# Patient Record
Sex: Female | Born: 1970
Health system: Southern US, Community
[De-identification: ages and names within clinical notes are randomized; demographics above are authoritative.]

## PROBLEM LIST (undated history)

## (undated) DIAGNOSIS — F419 Anxiety disorder, unspecified: Secondary | ICD-10-CM

## (undated) DIAGNOSIS — K8020 Calculus of gallbladder without cholecystitis without obstruction: Secondary | ICD-10-CM

## (undated) DIAGNOSIS — K602 Anal fissure, unspecified: Secondary | ICD-10-CM

## (undated) DIAGNOSIS — Z8719 Personal history of other diseases of the digestive system: Secondary | ICD-10-CM

## (undated) DIAGNOSIS — T7840XA Allergy, unspecified, initial encounter: Secondary | ICD-10-CM

## (undated) DIAGNOSIS — J309 Allergic rhinitis, unspecified: Secondary | ICD-10-CM

## (undated) HISTORY — DX: Allergic rhinitis, unspecified: J30.9

## (undated) HISTORY — DX: Anal fissure, unspecified: K60.2

## (undated) HISTORY — DX: Personal history of other diseases of the digestive system: Z87.19

## (undated) HISTORY — PX: CERVICAL DISC SURGERY: SHX588

## (undated) HISTORY — DX: Anxiety disorder, unspecified: F41.9

## (undated) HISTORY — PX: WISDOM TOOTH EXTRACTION: SHX21

## (undated) HISTORY — DX: Allergy, unspecified, initial encounter: T78.40XA

## (undated) HISTORY — DX: Calculus of gallbladder without cholecystitis without obstruction: K80.20

---

## 1999-09-10 ENCOUNTER — Inpatient Hospital Stay (HOSPITAL_COMMUNITY): Admission: AD | Admit: 1999-09-10 | Discharge: 1999-09-10 | Payer: Self-pay | Admitting: Obstetrics and Gynecology

## 1999-10-14 DIAGNOSIS — K8020 Calculus of gallbladder without cholecystitis without obstruction: Secondary | ICD-10-CM

## 1999-10-14 DIAGNOSIS — K602 Anal fissure, unspecified: Secondary | ICD-10-CM

## 1999-10-14 HISTORY — DX: Calculus of gallbladder without cholecystitis without obstruction: K80.20

## 1999-10-14 HISTORY — DX: Anal fissure, unspecified: K60.2

## 1999-10-14 HISTORY — PX: CHOLECYSTECTOMY: SHX55

## 1999-11-21 ENCOUNTER — Ambulatory Visit (HOSPITAL_COMMUNITY): Admission: RE | Admit: 1999-11-21 | Discharge: 1999-11-21 | Payer: Self-pay

## 1999-11-25 ENCOUNTER — Ambulatory Visit: Admission: RE | Admit: 1999-11-25 | Discharge: 1999-11-25 | Payer: Self-pay | Admitting: Obstetrics and Gynecology

## 2000-01-20 ENCOUNTER — Inpatient Hospital Stay (HOSPITAL_COMMUNITY): Admission: AD | Admit: 2000-01-20 | Discharge: 2000-01-20 | Payer: Self-pay | Admitting: Obstetrics and Gynecology

## 2000-01-22 ENCOUNTER — Inpatient Hospital Stay (HOSPITAL_COMMUNITY): Admission: AD | Admit: 2000-01-22 | Discharge: 2000-01-22 | Payer: Self-pay | Admitting: Obstetrics and Gynecology

## 2000-02-15 ENCOUNTER — Inpatient Hospital Stay (HOSPITAL_COMMUNITY): Admission: AD | Admit: 2000-02-15 | Discharge: 2000-02-17 | Payer: Self-pay | Admitting: Obstetrics and Gynecology

## 2000-03-29 ENCOUNTER — Emergency Department (HOSPITAL_COMMUNITY): Admission: EM | Admit: 2000-03-29 | Discharge: 2000-03-29 | Payer: Self-pay | Admitting: Emergency Medicine

## 2000-03-30 ENCOUNTER — Encounter: Payer: Self-pay | Admitting: Emergency Medicine

## 2000-04-02 ENCOUNTER — Other Ambulatory Visit: Admission: RE | Admit: 2000-04-02 | Discharge: 2000-04-02 | Payer: Self-pay | Admitting: Obstetrics and Gynecology

## 2000-04-27 ENCOUNTER — Observation Stay (HOSPITAL_COMMUNITY): Admission: RE | Admit: 2000-04-27 | Discharge: 2000-04-28 | Payer: Self-pay | Admitting: General Surgery

## 2000-12-14 ENCOUNTER — Other Ambulatory Visit: Admission: RE | Admit: 2000-12-14 | Discharge: 2000-12-14 | Payer: Self-pay | Admitting: Obstetrics and Gynecology

## 2001-04-12 ENCOUNTER — Ambulatory Visit (HOSPITAL_COMMUNITY): Admission: RE | Admit: 2001-04-12 | Discharge: 2001-04-12 | Payer: Self-pay | Admitting: Obstetrics and Gynecology

## 2001-04-20 ENCOUNTER — Inpatient Hospital Stay (HOSPITAL_COMMUNITY): Admission: AD | Admit: 2001-04-20 | Discharge: 2001-04-20 | Payer: Self-pay | Admitting: Obstetrics and Gynecology

## 2001-06-21 ENCOUNTER — Inpatient Hospital Stay (HOSPITAL_COMMUNITY): Admission: AD | Admit: 2001-06-21 | Discharge: 2001-06-24 | Payer: Self-pay | Admitting: Obstetrics and Gynecology

## 2001-08-09 ENCOUNTER — Other Ambulatory Visit: Admission: RE | Admit: 2001-08-09 | Discharge: 2001-08-09 | Payer: Self-pay | Admitting: Obstetrics and Gynecology

## 2002-08-26 ENCOUNTER — Other Ambulatory Visit: Admission: RE | Admit: 2002-08-26 | Discharge: 2002-08-26 | Payer: Self-pay | Admitting: Obstetrics and Gynecology

## 2003-04-20 ENCOUNTER — Ambulatory Visit (HOSPITAL_COMMUNITY): Admission: RE | Admit: 2003-04-20 | Discharge: 2003-04-20 | Payer: Self-pay | Admitting: Obstetrics and Gynecology

## 2003-08-28 ENCOUNTER — Ambulatory Visit (HOSPITAL_COMMUNITY): Admission: RE | Admit: 2003-08-28 | Discharge: 2003-08-28 | Payer: Self-pay | Admitting: Obstetrics and Gynecology

## 2003-11-03 ENCOUNTER — Inpatient Hospital Stay (HOSPITAL_COMMUNITY): Admission: AD | Admit: 2003-11-03 | Discharge: 2003-11-05 | Payer: Self-pay | Admitting: Obstetrics and Gynecology

## 2003-12-11 ENCOUNTER — Other Ambulatory Visit: Admission: RE | Admit: 2003-12-11 | Discharge: 2003-12-11 | Payer: Self-pay | Admitting: Obstetrics and Gynecology

## 2004-08-08 ENCOUNTER — Ambulatory Visit (HOSPITAL_COMMUNITY): Admission: RE | Admit: 2004-08-08 | Discharge: 2004-08-08 | Payer: Self-pay | Admitting: Neurology

## 2005-02-10 ENCOUNTER — Other Ambulatory Visit: Admission: RE | Admit: 2005-02-10 | Discharge: 2005-02-10 | Payer: Self-pay | Admitting: Obstetrics and Gynecology

## 2012-02-04 ENCOUNTER — Other Ambulatory Visit: Payer: Self-pay | Admitting: Obstetrics and Gynecology

## 2012-02-04 DIAGNOSIS — R928 Other abnormal and inconclusive findings on diagnostic imaging of breast: Secondary | ICD-10-CM

## 2012-02-09 ENCOUNTER — Ambulatory Visit
Admission: RE | Admit: 2012-02-09 | Discharge: 2012-02-09 | Disposition: A | Discharge status: Home / Self Care | Payer: BC Managed Care – PPO | Source: Ambulatory Visit | Attending: Obstetrics and Gynecology | Admitting: Obstetrics and Gynecology

## 2012-02-09 DIAGNOSIS — R928 Other abnormal and inconclusive findings on diagnostic imaging of breast: Secondary | ICD-10-CM

## 2014-05-31 ENCOUNTER — Encounter: Payer: Self-pay | Admitting: Gastroenterology

## 2014-07-31 ENCOUNTER — Encounter: Payer: Self-pay | Admitting: Gastroenterology

## 2014-07-31 ENCOUNTER — Other Ambulatory Visit (INDEPENDENT_AMBULATORY_CARE_PROVIDER_SITE_OTHER): Payer: BC Managed Care – PPO

## 2014-07-31 ENCOUNTER — Ambulatory Visit (INDEPENDENT_AMBULATORY_CARE_PROVIDER_SITE_OTHER): Payer: BC Managed Care – PPO | Admitting: Gastroenterology

## 2014-07-31 VITALS — BP 110/62 | HR 68 | Ht 67.0 in | Wt 138.4 lb

## 2014-07-31 DIAGNOSIS — R109 Unspecified abdominal pain: Secondary | ICD-10-CM

## 2014-07-31 LAB — CBC WITH DIFFERENTIAL/PLATELET
BASOS PCT: 0.3 % (ref 0.0–3.0)
Basophils Absolute: 0 10*3/uL (ref 0.0–0.1)
EOS PCT: 0.9 % (ref 0.0–5.0)
Eosinophils Absolute: 0 10*3/uL (ref 0.0–0.7)
HCT: 40 % (ref 36.0–46.0)
Hemoglobin: 13.4 g/dL (ref 12.0–15.0)
Lymphocytes Relative: 34.1 % (ref 12.0–46.0)
Lymphs Abs: 1.7 10*3/uL (ref 0.7–4.0)
MCHC: 33.6 g/dL (ref 30.0–36.0)
MCV: 95.2 fl (ref 78.0–100.0)
MONOS PCT: 6.9 % (ref 3.0–12.0)
Monocytes Absolute: 0.3 10*3/uL (ref 0.1–1.0)
Neutro Abs: 2.8 10*3/uL (ref 1.4–7.7)
Neutrophils Relative %: 57.8 % (ref 43.0–77.0)
PLATELETS: 180 10*3/uL (ref 150.0–400.0)
RBC: 4.2 Mil/uL (ref 3.87–5.11)
RDW: 12.8 % (ref 11.5–15.5)
WBC: 4.9 10*3/uL (ref 4.0–10.5)

## 2014-07-31 LAB — COMPREHENSIVE METABOLIC PANEL
ALT: 17 U/L (ref 0–35)
AST: 20 U/L (ref 0–37)
Albumin: 3.6 g/dL (ref 3.5–5.2)
Alkaline Phosphatase: 60 U/L (ref 39–117)
BUN: 9 mg/dL (ref 6–23)
CALCIUM: 9.4 mg/dL (ref 8.4–10.5)
CO2: 30 meq/L (ref 19–32)
Chloride: 105 mEq/L (ref 96–112)
Creatinine, Ser: 0.7 mg/dL (ref 0.4–1.2)
GFR: 92.08 mL/min (ref >60.00)
GLUCOSE: 104 mg/dL — AB (ref 70–99)
POTASSIUM: 4.4 meq/L (ref 3.5–5.1)
SODIUM: 139 meq/L (ref 135–145)
TOTAL PROTEIN: 7.5 g/dL (ref 6.0–8.3)
Total Bilirubin: 0.5 mg/dL (ref 0.2–1.2)

## 2014-07-31 LAB — TSH: TSH: 1 u[IU]/mL (ref 0.35–4.50)

## 2014-07-31 LAB — SEDIMENTATION RATE: Sed Rate: 14 mm/hr (ref 0–22)

## 2014-07-31 NOTE — Progress Notes (Signed)
HPI: This is a very pleasant 43 year old woman whom I meeting for the first time today.     Discomfort in abd, full feeling, indigestion. Was bad in April and this past summer. Was put on flagyl and two rounds of PPI and the symptoms are better but still not normal.    Still has feeling of fullness, globus type sensation in AM usually.  Dirrhrea 3 times per week, loose stools. Previously was one solid, normal BM.  Prilosec helped for a week twice.    No nsaids.  She lost weigth over the summer, but gained it back.  No colon cancer in her family.  No colitis.   Review of systems: Pertinent positive and negative review of systems were noted in the above HPI section. Complete review of systems was performed and was otherwise normal.    Past Medical History  Diagnosis Date  . Anal fissure 2001  . Anxiety   . Gallstones 2001  . Allergic rhinitis     Past Surgical History  Procedure Laterality Date  . Cholecystectomy  2001    Current Outpatient Prescriptions  Medication Sig Dispense Refill  . loratadine (CLARITIN) 10 MG tablet Take 10 mg by mouth daily.      . Multiple Vitamin (MULTIVITAMIN) tablet Take 1 tablet by mouth daily.      . sertraline (ZOLOFT) 100 MG tablet Take 100 mg by mouth daily.      Marland Kitchen. triamcinolone (NASACORT ALLERGY 24HR) 55 MCG/ACT AERO nasal inhaler Place 2 sprays into the nose as needed.       No current facility-administered medications for this visit.    Allergies as of 07/31/2014 - Review Complete 07/31/2014  Allergen Reaction Noted  . Codeine Nausea And Vomiting 07/31/2014    Family History  Problem Relation Age of Onset  . Breast cancer Mother   . Breast cancer Paternal Grandmother   . Breast cancer Maternal Grandmother   . Other Father     nerve tumor  . Other Son     nerve tumor    History   Social History  . Marital Status: Married    Spouse Name: N/A    Number of Children: 3  . Years of Education: N/A   Occupational History   . minister   . attorney    Social History Main Topics  . Smoking status: Former Smoker -- 3 years    Types: Cigarettes    Quit date: 07/31/1994  . Smokeless tobacco: Never Used     Comment: in college  . Alcohol Use: 3.5 oz/week    7 drink(s) per week     Comment: 1 per week  . Drug Use: No  . Sexual Activity: Not on file   Other Topics Concern  . Not on file   Social History Narrative  . No narrative on file       Physical Exam: BP 110/62  Pulse 68  Ht 5\' 7"  (1.702 m)  Wt 138 lb 6 oz (62.766 kg)  BMI 21.67 kg/m2  LMP 07/16/2014 Constitutional: generally well-appearing Psychiatric: alert and oriented x3 Eyes: extraocular movements intact Mouth: oral pharynx moist, no lesions Neck: supple no lymphadenopathy Cardiovascular: heart regular rate and rhythm Lungs: clear to auscultation bilaterally Abdomen: soft, nontender, nondistended, no obvious ascites, no peritoneal signs, normal bowel sounds Extremities: no lower extremity edema bilaterally Skin: no lesions on visible extremities    Assessment and plan: 43 y.o. female with  dyspepsia, chest discomfort, change in bowels  She traveled  to [place] about a year ago for a mission trip. Her symptoms seemed to start after she returned home and it actually improved over the past few months. He were quite severe during the spring and early summer now she is left with bothersome bloating, some mild changes in her bowels and some chest discomfort. Proton pump inhibitors haven't helped briefly but not completely and not permanently. She'll have a basic set of labs including CBC, complete metabolic profile, stool testing, sedimentation rate. I will also proceed with EGD at her soonest convenience to check for peptic gastritis, perhaps H. pylori.

## 2014-07-31 NOTE — Patient Instructions (Signed)
You will have labs checked today in the basement lab.  Please head down after you check out with the front desk  (cmet, cbc, tsh, esr, stool for gi pathogen panel). You will be set up for an upper endoscopy for abdominal discomfort, bloating. A copy of this information will be made available to Dr. Joylene Draft.

## 2014-08-01 LAB — GASTROINTESTINAL PATHOGEN PANEL PCR
C. DIFFICILE TOX A/B, PCR: NEGATIVE
Campylobacter, PCR: NEGATIVE
Cryptosporidium, PCR: NEGATIVE
E coli (ETEC) LT/ST PCR: NEGATIVE
E coli (STEC) stx1/stx2, PCR: NEGATIVE
E coli 0157, PCR: NEGATIVE
GIARDIA LAMBLIA, PCR: NEGATIVE
NOROVIRUS, PCR: NEGATIVE
ROTAVIRUS, PCR: NEGATIVE
SALMONELLA, PCR: NEGATIVE
Shigella, PCR: NEGATIVE

## 2014-08-07 ENCOUNTER — Encounter: Payer: Self-pay | Admitting: Gastroenterology

## 2014-09-11 ENCOUNTER — Telehealth: Payer: Self-pay | Admitting: Gastroenterology

## 2014-09-11 NOTE — Telephone Encounter (Signed)
Returned patient;s call and she states that she has been vomiting since yesterday evening and the last time she vomited today was 3 am.  Darlyn Readelia McCoy, RN spoke with Dr. Christella HartiganJacobs and he advised the patient to reschedule.  There is not another available appt until Jan. 28th and patient wanted to keep the appt. For tomorrow.  I advised her that she needs to call if she is still sick and vomiting tomorrow.  She agreed to do so.  Dr. Christella HartiganJacobs was made aware of this.

## 2014-09-12 ENCOUNTER — Encounter: Payer: Self-pay | Admitting: Gastroenterology

## 2014-09-12 ENCOUNTER — Ambulatory Visit (AMBULATORY_SURGERY_CENTER): Payer: BC Managed Care – PPO | Admitting: Gastroenterology

## 2014-09-12 VITALS — BP 129/53 | HR 56 | Temp 97.6°F | Resp 18 | Ht 67.0 in | Wt 138.0 lb

## 2014-09-12 DIAGNOSIS — K2970 Gastritis, unspecified, without bleeding: Secondary | ICD-10-CM

## 2014-09-12 DIAGNOSIS — R109 Unspecified abdominal pain: Secondary | ICD-10-CM

## 2014-09-12 DIAGNOSIS — K2990 Gastroduodenitis, unspecified, without bleeding: Secondary | ICD-10-CM

## 2014-09-12 MED ORDER — SODIUM CHLORIDE 0.9 % IV SOLN: 500.0000 mL | INTRAVENOUS | Status: DC

## 2014-09-12 NOTE — Patient Instructions (Signed)
YOU HAD AN ENDOSCOPIC PROCEDURE TODAY AT THE [facility]: Refer to the procedure report that was given to you for any specific questions about what was found during the examination.  If the procedure report does not answer your questions, please call your gastroenterologist to clarify.  If you requested that your care partner not be given the details of your procedure findings, then the procedure report has been included in a sealed envelope for you to review at your convenience later.  YOU SHOULD EXPECT: Some feelings of bloating in the abdomen. Passage of more gas than usual.  Walking can help get rid of the air that was put into your GI tract during the procedure and reduce the bloating. If you had a lower endoscopy (such as a colonoscopy or flexible sigmoidoscopy) you may notice spotting of blood in your stool or on the toilet paper. If you underwent a bowel prep for your procedure, then you may not have a normal bowel movement for a few days.  DIET: Your first meal following the procedure should be a light meal and then it is ok to progress to your normal diet.  A half-sandwich or bowl of soup is an example of a good first meal.  Heavy or fried foods are harder to digest and may make you feel nauseous or bloated.  Likewise meals heavy in dairy and vegetables can cause extra gas to form and this can also increase the bloating.  Drink plenty of fluids but you should avoid alcoholic beverages for 24 hours.  ACTIVITY: Your care partner should take you home directly after the procedure.  You should plan to take it easy, moving slowly for the rest of the day.  You can resume normal activity the day after the procedure however you should NOT DRIVE or use heavy machinery for 24 hours (because of the sedation medicines used during the test).    SYMPTOMS TO REPORT IMMEDIATELY: A gastroenterologist can be reached at any hour.  During normal business hours, 8:30 AM to 5:00 PM Monday through Friday,  call [phone].  After hours and on weekends, please call the GI answering service at [phone] who will take a message and have the physician on call contact you.    Following upper endoscopy (EGD)  Vomiting of blood or coffee ground material  New chest pain or pain under the shoulder blades  Painful or persistently difficult swallowing  New shortness of breath  Fever of 100F or higher  Black, tarry-looking stools  FOLLOW UP: If any biopsies were taken you will be contacted by phone or by letter within the next 1-3 weeks.  Call your gastroenterologist if you have not heard about the biopsies in 3 weeks.  Our staff will call the home number listed on your records the next business day following your procedure to check on you and address any questions or concerns that you may have at that time regarding the information given to you following your procedure. This is a courtesy call and so if there is no answer at the home number and we have not heard from you through the emergency physician on call, we will assume that you have returned to your regular daily activities without incident.  SIGNATURES/CONFIDENTIALITY: You and/or your care partner have signed paperwork which will be entered into your electronic medical record.  These signatures attest to the fact that that the information above on your After Visit Summary has been reviewed and is understood.  Full   responsibility of the confidentiality of this discharge information lies with you and/or your care-partner.   Resume medications. Information given on gastritis with discharge instructions. 

## 2014-09-12 NOTE — Op Note (Signed)
[facility] [address] [place], [id number]   ENDOSCOPY PROCEDURE REPORT  PATIENT: Adriana NedShields, Meira S  MR#: [id number] BIRTHDATE: 1971/03/28 , 43  yrs. old GENDER: female ENDOSCOPIST: Rachael Feeaniel P Jacobs, MD REFERRED BY:  Rodrigo RanMark Perini, M.D. PROCEDURE DATE:  09/12/2014 PROCEDURE:  EGD w/ biopsy ASA CLASS:     Class II INDICATIONS:  dyspepsia, fullness (especially AM). MEDICATIONS: Monitored anesthesia care and Propofol 200 mg IV TOPICAL ANESTHETIC: Cetacaine Spray  DESCRIPTION OF PROCEDURE: After the risks benefits and alternatives of the procedure were thoroughly explained, informed consent was obtained.  The LB [id number] endoscope was introduced through the mouth and advanced to the second portion of the duodenum , Without limitations.  The instrument was slowly withdrawn as the mucosa was fully examined.   There was mild to moderate pan-gastritis.  The distal stomach was biopsied, sent to pathology.  The examination was otherwise normal. Retroflexed views revealed no abnormalities.     The scope was then withdrawn from the patient and the procedure completed.  COMPLICATIONS: There were no immediate complications.  ENDOSCOPIC IMPRESSION: There was mild to moderate pan-gastritis.  The distal stomach was biopsied, sent to pathology.  The examination was otherwise normal   RECOMMENDATIONS: Await final pathology.  If biopsies + for H.  pylori, will start on appropriate antibiotics.   eSigned:  Rachael Feeaniel P Jacobs, MD 09/12/2014 3:22 PM

## 2014-09-12 NOTE — Progress Notes (Signed)
Called to room to assist during endoscopic procedure.  Patient ID and intended procedure confirmed with present staff. Received instructions for my participation in the procedure from the performing physician.  

## 2014-09-13 ENCOUNTER — Telehealth: Payer: Self-pay | Admitting: *Deleted

## 2014-09-13 NOTE — Telephone Encounter (Signed)
  Follow up Call-  Call back number 09/12/2014  Post procedure Call Back phone  # [phone]  Permission to leave phone message Yes     Patient questions:  Do you have a fever, pain , or abdominal swelling? No. Pain Score  0 *  Have you tolerated food without any problems? Yes.    Have you been able to return to your normal activities? Yes.    Do you have any questions about your discharge instructions: Diet   No. Medications  No. Follow up visit  No.  Do you have questions or concerns about your Care? No.  Actions: * If pain score is 4 or above: No action needed, pain <4.

## 2014-09-20 ENCOUNTER — Encounter: Payer: Self-pay | Admitting: Gastroenterology

## 2014-09-21 ENCOUNTER — Other Ambulatory Visit: Payer: Self-pay | Admitting: Obstetrics and Gynecology

## 2014-09-21 ENCOUNTER — Encounter: Payer: Self-pay | Admitting: Gastroenterology

## 2014-09-22 LAB — CYTOLOGY - PAP

## 2015-06-01 ENCOUNTER — Other Ambulatory Visit: Payer: Self-pay | Admitting: Internal Medicine

## 2015-06-01 DIAGNOSIS — M546 Pain in thoracic spine: Secondary | ICD-10-CM

## 2015-06-05 ENCOUNTER — Ambulatory Visit
Admission: RE | Admit: 2015-06-05 | Discharge: 2015-06-05 | Disposition: A | Discharge status: Home / Self Care | Payer: BLUE CROSS/BLUE SHIELD | Source: Ambulatory Visit | Attending: Internal Medicine | Admitting: Internal Medicine

## 2015-06-05 DIAGNOSIS — M546 Pain in thoracic spine: Secondary | ICD-10-CM

## 2015-11-06 ENCOUNTER — Other Ambulatory Visit: Payer: Self-pay | Admitting: Obstetrics and Gynecology

## 2015-11-06 DIAGNOSIS — R928 Other abnormal and inconclusive findings on diagnostic imaging of breast: Secondary | ICD-10-CM

## 2015-11-20 ENCOUNTER — Ambulatory Visit
Admission: RE | Admit: 2015-11-20 | Discharge: 2015-11-20 | Disposition: A | Discharge status: Home / Self Care | Payer: BLUE CROSS/BLUE SHIELD | Source: Ambulatory Visit | Attending: Obstetrics and Gynecology | Admitting: Obstetrics and Gynecology

## 2015-11-20 DIAGNOSIS — R928 Other abnormal and inconclusive findings on diagnostic imaging of breast: Secondary | ICD-10-CM

## 2016-07-09 ENCOUNTER — Other Ambulatory Visit: Payer: Self-pay | Admitting: Obstetrics and Gynecology

## 2016-07-09 DIAGNOSIS — N6310 Unspecified lump in the right breast, unspecified quadrant: Secondary | ICD-10-CM

## 2016-07-22 ENCOUNTER — Ambulatory Visit
Admission: RE | Admit: 2016-07-22 | Discharge: 2016-07-22 | Disposition: A | Discharge status: Home / Self Care | Payer: BLUE CROSS/BLUE SHIELD | Source: Ambulatory Visit | Attending: Obstetrics and Gynecology | Admitting: Obstetrics and Gynecology

## 2016-07-22 DIAGNOSIS — N6310 Unspecified lump in the right breast, unspecified quadrant: Secondary | ICD-10-CM

## 2016-11-03 DIAGNOSIS — F4323 Adjustment disorder with mixed anxiety and depressed mood: Secondary | ICD-10-CM | POA: Diagnosis not present

## 2016-11-10 DIAGNOSIS — F4323 Adjustment disorder with mixed anxiety and depressed mood: Secondary | ICD-10-CM | POA: Diagnosis not present

## 2016-11-17 DIAGNOSIS — F4323 Adjustment disorder with mixed anxiety and depressed mood: Secondary | ICD-10-CM | POA: Diagnosis not present

## 2016-11-24 DIAGNOSIS — F4323 Adjustment disorder with mixed anxiety and depressed mood: Secondary | ICD-10-CM | POA: Diagnosis not present

## 2016-12-23 DIAGNOSIS — Z6821 Body mass index (BMI) 21.0-21.9, adult: Secondary | ICD-10-CM | POA: Diagnosis not present

## 2016-12-23 DIAGNOSIS — Z01419 Encounter for gynecological examination (general) (routine) without abnormal findings: Secondary | ICD-10-CM | POA: Diagnosis not present

## 2017-09-14 ENCOUNTER — Other Ambulatory Visit: Payer: Self-pay | Admitting: Obstetrics and Gynecology

## 2017-09-14 DIAGNOSIS — Z139 Encounter for screening, unspecified: Secondary | ICD-10-CM

## 2017-10-16 ENCOUNTER — Ambulatory Visit
Admission: RE | Admit: 2017-10-16 | Discharge: 2017-10-16 | Disposition: A | Discharge status: Home / Self Care | Payer: BLUE CROSS/BLUE SHIELD | Source: Ambulatory Visit | Attending: Obstetrics and Gynecology | Admitting: Obstetrics and Gynecology

## 2017-10-16 DIAGNOSIS — Z139 Encounter for screening, unspecified: Secondary | ICD-10-CM

## 2017-10-19 ENCOUNTER — Other Ambulatory Visit: Payer: Self-pay | Admitting: Internal Medicine

## 2017-10-19 DIAGNOSIS — N63 Unspecified lump in unspecified breast: Secondary | ICD-10-CM

## 2017-10-21 ENCOUNTER — Ambulatory Visit
Admission: RE | Admit: 2017-10-21 | Discharge: 2017-10-21 | Disposition: A | Discharge status: Home / Self Care | Payer: BLUE CROSS/BLUE SHIELD | Source: Ambulatory Visit | Attending: Internal Medicine | Admitting: Internal Medicine

## 2017-10-21 DIAGNOSIS — N63 Unspecified lump in unspecified breast: Secondary | ICD-10-CM

## 2017-10-21 DIAGNOSIS — R922 Inconclusive mammogram: Secondary | ICD-10-CM | POA: Diagnosis not present

## 2017-10-21 DIAGNOSIS — N6310 Unspecified lump in the right breast, unspecified quadrant: Secondary | ICD-10-CM | POA: Diagnosis not present

## 2018-07-29 DIAGNOSIS — E538 Deficiency of other specified B group vitamins: Secondary | ICD-10-CM | POA: Diagnosis not present

## 2018-07-29 DIAGNOSIS — D538 Other specified nutritional anemias: Secondary | ICD-10-CM | POA: Diagnosis not present

## 2018-07-29 DIAGNOSIS — Z0000 Encounter for general adult medical examination without abnormal findings: Secondary | ICD-10-CM | POA: Diagnosis not present

## 2018-07-29 DIAGNOSIS — R82998 Other abnormal findings in urine: Secondary | ICD-10-CM | POA: Diagnosis not present

## 2018-07-29 DIAGNOSIS — E7849 Other hyperlipidemia: Secondary | ICD-10-CM | POA: Diagnosis not present

## 2018-08-04 DIAGNOSIS — D538 Other specified nutritional anemias: Secondary | ICD-10-CM | POA: Diagnosis not present

## 2018-08-04 DIAGNOSIS — Z23 Encounter for immunization: Secondary | ICD-10-CM | POA: Diagnosis not present

## 2018-08-04 DIAGNOSIS — M5020 Other cervical disc displacement, unspecified cervical region: Secondary | ICD-10-CM | POA: Diagnosis not present

## 2018-08-04 DIAGNOSIS — Z1389 Encounter for screening for other disorder: Secondary | ICD-10-CM | POA: Diagnosis not present

## 2018-08-04 DIAGNOSIS — J3089 Other allergic rhinitis: Secondary | ICD-10-CM | POA: Diagnosis not present

## 2018-08-04 DIAGNOSIS — F418 Other specified anxiety disorders: Secondary | ICD-10-CM | POA: Diagnosis not present

## 2018-08-04 DIAGNOSIS — Z0000 Encounter for general adult medical examination without abnormal findings: Secondary | ICD-10-CM | POA: Diagnosis not present

## 2018-08-06 DIAGNOSIS — Z1212 Encounter for screening for malignant neoplasm of rectum: Secondary | ICD-10-CM | POA: Diagnosis not present

## 2018-10-04 ENCOUNTER — Other Ambulatory Visit: Payer: Self-pay | Admitting: Internal Medicine

## 2018-10-04 DIAGNOSIS — Z1231 Encounter for screening mammogram for malignant neoplasm of breast: Secondary | ICD-10-CM

## 2018-11-04 ENCOUNTER — Ambulatory Visit
Admission: RE | Admit: 2018-11-04 | Discharge: 2018-11-04 | Disposition: A | Discharge status: Home / Self Care | Payer: BLUE CROSS/BLUE SHIELD | Source: Ambulatory Visit | Attending: Internal Medicine | Admitting: Internal Medicine

## 2018-11-04 DIAGNOSIS — Z1231 Encounter for screening mammogram for malignant neoplasm of breast: Secondary | ICD-10-CM

## 2018-11-10 DIAGNOSIS — Z6821 Body mass index (BMI) 21.0-21.9, adult: Secondary | ICD-10-CM | POA: Diagnosis not present

## 2018-11-10 DIAGNOSIS — Z01419 Encounter for gynecological examination (general) (routine) without abnormal findings: Secondary | ICD-10-CM | POA: Diagnosis not present

## 2019-01-10 ENCOUNTER — Other Ambulatory Visit: Payer: Self-pay | Admitting: Internal Medicine

## 2019-01-10 ENCOUNTER — Other Ambulatory Visit: Payer: Self-pay

## 2019-01-10 ENCOUNTER — Ambulatory Visit (HOSPITAL_COMMUNITY)
Admission: RE | Admit: 2019-01-10 | Discharge: 2019-01-10 | Disposition: A | Discharge status: Home / Self Care | Payer: BLUE CROSS/BLUE SHIELD | Source: Ambulatory Visit | Attending: Internal Medicine | Admitting: Internal Medicine

## 2019-01-10 ENCOUNTER — Other Ambulatory Visit (HOSPITAL_COMMUNITY): Payer: Self-pay | Admitting: Internal Medicine

## 2019-01-10 DIAGNOSIS — R1031 Right lower quadrant pain: Secondary | ICD-10-CM

## 2019-01-10 MED ORDER — IOHEXOL 300 MG/ML  SOLN
100.0000 mL | Freq: Once | INTRAMUSCULAR | Status: AC | PRN
  Administered 2019-01-10: 100 mL via INTRAVENOUS

## 2019-02-23 ENCOUNTER — Ambulatory Visit (INDEPENDENT_AMBULATORY_CARE_PROVIDER_SITE_OTHER): Payer: BLUE CROSS/BLUE SHIELD | Admitting: Gastroenterology

## 2019-02-23 ENCOUNTER — Other Ambulatory Visit: Payer: Self-pay

## 2019-02-23 ENCOUNTER — Encounter: Payer: Self-pay | Admitting: Gastroenterology

## 2019-02-23 VITALS — Ht 67.0 in | Wt 135.0 lb

## 2019-02-23 DIAGNOSIS — K5732 Diverticulitis of large intestine without perforation or abscess without bleeding: Secondary | ICD-10-CM | POA: Diagnosis not present

## 2019-02-23 MED ORDER — PEG 3350-KCL-NA BICARB-NACL 420 G PO SOLR: 4000.0000 mL | ORAL | 0 refills | Status: DC

## 2019-02-23 NOTE — Progress Notes (Signed)
Review of pertinent gastrointestinal problems: 1.  Dyspepsia, abdominal fullness work-up 2015; lab testing October 2015 CBC, sed rate, TSH, complete metabolic profile were all normal.  Upper endoscopy December 2015, Dr. Christella HartiganJacobs done for dyspepsia, fullness especially in the mornings.  Findings mild to moderate pan gastritis, biopsies were taken and showed no sign of infection or cancer.   This service was provided via virtual visit.  Both audio and visual were used. The patient was located at home.  I was located in my office.  The patient did consent to this virtual visit and is aware of possible charges through their insurance for this visit.  The patient is a .  My certified medical assistant, Barbaraann RondoKelly Tadros, contributed to this visit by contacting the patient by phone 1 or 2 business days prior to the appointment and also followed up on the recommendations I made after the visit.  Time spent on virtual visit: 28 min   HPI: This is a very pleasant 48 year old woman whom I last saw for 5 years ago at the time of upper endoscopy and some upper GI issues.   She was referred back for a new problem by her primary care physician Dr. Sherrye PayorParini.  About 2 months ago, she awoke with RLQ pain, then fevers set in. She saw her PCP.   She completed a course of augmentin (had unpleasant experience on flagyl, turned her tongue yellow and messed with her taste).  10-14 day course and her symptoms went away after 2-3 days of starting.  Shortly after completing the course her fevers, pains recurred (worse than the first time).  Augmenting repeated 2 weeks which ended 2 weeks ago and she's felt nearly normal now.  Feels great in comparison now, but still has intermittent odd pains located in the same location.  Never really left sided pains.  Overall she's lost weight due to a poor appetite; 3-5 pounds overall.  No FH of colon cancer.  She's never had a colonoscopy but she has had stool testing (in 2019 with her  PCP).  CT scan abdomen pelvis with IV and oral contrast March 2020 done for right lower quadrant pain.  Findings "acute diverticulitis of the mid sigmoid colon.  No abscess or perforation.  Normal appendix."  I reviewed the report and the images personally.   Chief complaint is acute diverticulitis  ROS: complete GI ROS as described in HPI, all other review negative.  Constitutional:  No unintentional weight loss   Past Medical History:  Diagnosis Date  . Allergic rhinitis   . Allergy   . Anal fissure 2001  . Anxiety   . Gallstones 2001    Past Surgical History:  Procedure Laterality Date  . CERVICAL DISC SURGERY    . CHOLECYSTECTOMY  2001  . WISDOM TOOTH EXTRACTION      Current Outpatient Medications  Medication Sig Dispense Refill  . ibuprofen (ADVIL,MOTRIN) 200 MG tablet Take 200 mg by mouth every 6 (six) hours as needed.    . loratadine (CLARITIN) 10 MG tablet Take 10 mg by mouth daily.    . Multiple Vitamin (MULTIVITAMIN) tablet Take 1 tablet by mouth daily.    . sertraline (ZOLOFT) 100 MG tablet Take 100 mg by mouth daily.    Marland Kitchen. triamcinolone (NASACORT ALLERGY 24HR) 55 MCG/ACT AERO nasal inhaler Place 2 sprays into the nose as needed.     No current facility-administered medications for this visit.     Allergies as of 02/23/2019 - Review Complete 02/22/2019  Allergen Reaction Noted  . Codeine Nausea And Vomiting 07/31/2014    Family History  Problem Relation Age of Onset  . Breast cancer Mother 38  . Other Father        nerve tumor  . Breast cancer Paternal Grandmother   . Breast cancer Maternal Grandmother   . Other Son        nerve tumor  . Tuberculosis Brother     Social History   Socioeconomic History  . Marital status: Married    Spouse name: Not on file  . Number of children: 3  . Years of education: Not on file  . Highest education level: Not on file  Occupational History  . Occupation: Optician, dispensing  . Occupation: attorney  Social Needs  .  Financial resource strain: Not on file  . Food insecurity:    Worry: Not on file    Inability: Not on file  . Transportation needs:    Medical: Not on file    Non-medical: Not on file  Tobacco Use  . Smoking status: Former Smoker    Years: 3.00    Types: Cigarettes    Last attempt to quit: 07/31/1994    Years since quitting: 24.5  . Smokeless tobacco: Never Used  . Tobacco comment: in college  Substance and Sexual Activity  . Alcohol use: Yes    Alcohol/week: 7.0 standard drinks    Types: 7 Standard drinks or equivalent per week    Comment: 1 per week  . Drug use: No  . Sexual activity: Not on file  Lifestyle  . Physical activity:    Days per week: Not on file    Minutes per session: Not on file  . Stress: Not on file  Relationships  . Social connections:    Talks on phone: Not on file    Gets together: Not on file    Attends religious service: Not on file    Active member of club or organization: Not on file    Attends meetings of clubs or organizations: Not on file    Relationship status: Not on file  . Intimate partner violence:    Fear of current or ex partner: Not on file    Emotionally abused: Not on file    Physically abused: Not on file    Forced sexual activity: Not on file  Other Topics Concern  . Not on file  Social History Narrative  . Not on file     Physical Exam: Unable to perform because this was a "telemed visit" due to current Covid-19 pandemic  Assessment and plan: 48 y.o. female with acute diverticulitis  We discussed diverticulosis, diverticulitis.  I explained to her that she does not need to avoid seeds and nuts, berries in her diet as this is been shown to not decrease incidence of recurrent diverticulitis.  I did advise that she try to keep a relatively high-fiber diet, with a goal of keeping her bowels moving regularly without much pushing or straining.  She understands that colon cancer can sometimes mimic diverticulitis and I  recommended that she undergo colonoscopy to exclude that.  I see no reason for any further blood tests or imaging studies before colonoscopy.  Please see the "Patient Instructions" section for addition details about the plan.  Rob Bunting, MD [facility] 02/23/2019, 8:51 AM

## 2019-02-23 NOTE — Patient Instructions (Signed)
We will arrange a colonoscopy, my first available in [facility], for recent diverticulitis.  She knows that she does not need to avoid seeds, nuts, berries as that has been shown to not decrease chances of recurrent diverticulitis.

## 2019-03-02 ENCOUNTER — Telehealth: Payer: Self-pay | Admitting: *Deleted

## 2019-03-02 NOTE — Telephone Encounter (Signed)
Covid-19 travel screening questions  Have you traveled in the last 14 days? No If yes where?  Do you now or have you had a fever in the last 14 days? No  Do you have any respiratory symptoms of shortness of breath or cough now or in the last 14 days? No  Do you have any family members or close contacts with diagnosed or suspected Covid-19? No       

## 2019-03-02 NOTE — Telephone Encounter (Signed)
Called patient to complete pre-procedure Covid-19 screening. No answer. LMOM.       

## 2019-03-04 ENCOUNTER — Ambulatory Visit (AMBULATORY_SURGERY_CENTER): Payer: BLUE CROSS/BLUE SHIELD | Admitting: Gastroenterology

## 2019-03-04 ENCOUNTER — Other Ambulatory Visit: Payer: Self-pay

## 2019-03-04 ENCOUNTER — Encounter: Payer: Self-pay | Admitting: Gastroenterology

## 2019-03-04 VITALS — BP 112/65 | HR 55 | Temp 98.3°F | Resp 17 | Ht 67.0 in | Wt 135.0 lb

## 2019-03-04 DIAGNOSIS — K5732 Diverticulitis of large intestine without perforation or abscess without bleeding: Secondary | ICD-10-CM

## 2019-03-04 MED ORDER — SODIUM CHLORIDE 0.9 % IV SOLN: 500.0000 mL | INTRAVENOUS | Status: DC

## 2019-03-04 NOTE — Progress Notes (Addendum)
Carrizales, CMA - temp Juel Burrow, CMA - VS  Pt reported she had root canals in her front top two teeth.  They are slightly loose.  maw

## 2019-03-04 NOTE — Progress Notes (Signed)
PT taken to PACU. Monitors in place. VSS. Report given to RN. 

## 2019-03-04 NOTE — Op Note (Signed)
[facility] Patient Name: Adriana Simpson Procedure Date: 03/04/2019 10:43 AM MRN: [id number] Endoscopist: Rachael Fee , MD Age: 48 Referring MD:  Date of Birth: Jan 20, 1971 Gender: Female Account #: [id number] Procedure:                Colonoscopy Indications:              Follow-up of diverticulitis Medicines:                Monitored Anesthesia Care Procedure:                Pre-Anesthesia Assessment:                           - Prior to the procedure, a History and Physical                            was performed, and patient medications and                            allergies were reviewed. The patient's tolerance of                            previous anesthesia was also reviewed. The risks                            and benefits of the procedure and the sedation                            options and risks were discussed with the patient.                            All questions were answered, and informed consent                            was obtained. Prior Anticoagulants: The patient has                            taken no previous anticoagulant or antiplatelet                            agents. ASA Grade Assessment: II - A patient with                            mild systemic disease. After reviewing the risks                            and benefits, the patient was deemed in                            satisfactory condition to undergo the procedure.                           After obtaining informed consent, the colonoscope  was passed under direct vision. Throughout the                            procedure, the patient's blood pressure, pulse, and                            oxygen saturations were monitored continuously. The                            Colonoscope was introduced through the anus and                            advanced to the the cecum, identified by                            appendiceal orifice and ileocecal  valve. The                            colonoscopy was performed without difficulty. The                            patient tolerated the procedure well. The quality                            of the bowel preparation was good. The ileocecal                            valve, appendiceal orifice, and rectum were                            photographed. Scope In: 10:45:10 AM Scope Out: 10:54:25 AM Scope Withdrawal Time: 0 hours 5 minutes 43 seconds  Total Procedure Duration: 0 hours 9 minutes 15 seconds  Findings:                 A few small-mouthed diverticula were found in the                            left colon.                           The exam was otherwise without abnormality on                            direct and retroflexion views. Complications:            No immediate complications. Estimated blood loss:                            None. Estimated Blood Loss:     Estimated blood loss: none. Impression:               - Diverticulosis in the left colon.                           - The examination was otherwise normal on direct  and retroflexion views.                           - No polyps or cancers. Recommendation:           - Patient has a contact number available for                            emergencies. The signs and symptoms of potential                            delayed complications were discussed with the                            patient. Return to normal activities tomorrow.                            Written discharge instructions were provided to the                            patient.                           - Resume previous diet.                           - Continue present medications.                           - Repeat colonoscopy in 10 years for screening. Rachael Fee, MD 03/04/2019 10:57:54 AM This report has been signed electronically.

## 2019-03-04 NOTE — Patient Instructions (Signed)
Discharge instructions given. Handout on diverticulosis. Resume previous medications. YOU HAD AN ENDOSCOPIC PROCEDURE TODAY AT THE [facility]:   Refer to the procedure report that was given to you for any specific questions about what was found during the examination.  If the procedure report does not answer your questions, please call your gastroenterologist to clarify.  If you requested that your care partner not be given the details of your procedure findings, then the procedure report has been included in a sealed envelope for you to review at your convenience later.  YOU SHOULD EXPECT: Some feelings of bloating in the abdomen. Passage of more gas than usual.  Walking can help get rid of the air that was put into your GI tract during the procedure and reduce the bloating. If you had a lower endoscopy (such as a colonoscopy or flexible sigmoidoscopy) you may notice spotting of blood in your stool or on the toilet paper. If you underwent a bowel prep for your procedure, you may not have a normal bowel movement for a few days.  Please Note:  You might notice some irritation and congestion in your nose or some drainage.  This is from the oxygen used during your procedure.  There is no need for concern and it should clear up in a day or so.  SYMPTOMS TO REPORT IMMEDIATELY:   Following lower endoscopy (colonoscopy or flexible sigmoidoscopy):  Excessive amounts of blood in the stool  Significant tenderness or worsening of abdominal pains  Swelling of the abdomen that is new, acute  Fever of 100F or higher   For urgent or emergent issues, a gastroenterologist can be reached at any hour by calling [phone].   DIET:  We do recommend a small meal at first, but then you may proceed to your regular diet.  Drink plenty of fluids but you should avoid alcoholic beverages for 24 hours.  ACTIVITY:  You should plan to take it easy for the rest of today and you should NOT DRIVE or use  heavy machinery until tomorrow (because of the sedation medicines used during the test).    FOLLOW UP: Our staff will call the number listed on your records 48-72 hours following your procedure to check on you and address any questions or concerns that you may have regarding the information given to you following your procedure. If we do not reach you, we will leave a message.  We will attempt to reach you two times.  During this call, we will ask if you have developed any symptoms of COVID 19. If you develop any symptoms (for example fever, flu-like symptoms, shortness of breath, cough etc.) before then, please call [phone].  If any biopsies were taken you will be contacted by phone or by letter within the next 1-3 weeks.  Please call us at [phone] if you have not heard about the biopsies in 3 weeks.    SIGNATURES/CONFIDENTIALITY: You and/or your care partner have signed paperwork which will be entered into your electronic medical record.  These signatures attest to the fact that that the information above on your After Visit Summary has been reviewed and is understood.  Full responsibility of the confidentiality of this discharge information lies with you and/or your care-partner.

## 2019-03-08 ENCOUNTER — Telehealth: Payer: Self-pay

## 2019-03-08 ENCOUNTER — Telehealth: Payer: Self-pay | Admitting: *Deleted

## 2019-03-08 NOTE — Telephone Encounter (Signed)
  Follow up Call-  Call back number 03/04/2019  Post procedure Call Back phone  # #[phone] cell  Permission to leave phone message Yes  Some recent data might be hidden     Patient questions:  Do you have a fever, pain , or abdominal swelling? No. Pain Score  0 *  Have you tolerated food without any problems? Yes.    Have you been able to return to your normal activities? Yes.    Do you have any questions about your discharge instructions: Diet   No. Medications  No. Follow up visit  No.  Do you have questions or concerns about your Care? No.  Actions: * If pain score is 4 or above: No action needed, pain <4.   1. Have you developed a fever since your procedure? no  2.   Have you had an respiratory symptoms (SOB or cough) since your procedure?  no 3.   Have you tested positive for COVID 19 since your procedure  no  4.   Have you had any family members/close contacts diagnosed with the COVID 19 since your procedure?  no  If any of these questions are a yes, please inquire if patient has been seen by family doctor and route this note to Laverna Peace, Charity fundraiser.

## 2019-03-08 NOTE — Telephone Encounter (Signed)
Left message on follow up call. 

## 2019-09-20 DIAGNOSIS — E7849 Other hyperlipidemia: Secondary | ICD-10-CM | POA: Diagnosis not present

## 2019-09-27 DIAGNOSIS — Z0000 Encounter for general adult medical examination without abnormal findings: Secondary | ICD-10-CM | POA: Diagnosis not present

## 2019-09-27 DIAGNOSIS — Z1339 Encounter for screening examination for other mental health and behavioral disorders: Secondary | ICD-10-CM | POA: Diagnosis not present

## 2019-09-27 DIAGNOSIS — K5792 Diverticulitis of intestine, part unspecified, without perforation or abscess without bleeding: Secondary | ICD-10-CM | POA: Diagnosis not present

## 2019-09-27 DIAGNOSIS — Z803 Family history of malignant neoplasm of breast: Secondary | ICD-10-CM | POA: Diagnosis not present

## 2019-09-27 DIAGNOSIS — M5020 Other cervical disc displacement, unspecified cervical region: Secondary | ICD-10-CM | POA: Diagnosis not present

## 2019-09-27 DIAGNOSIS — D539 Nutritional anemia, unspecified: Secondary | ICD-10-CM | POA: Diagnosis not present

## 2019-10-05 DIAGNOSIS — R82998 Other abnormal findings in urine: Secondary | ICD-10-CM | POA: Diagnosis not present

## 2019-11-29 DIAGNOSIS — Z01419 Encounter for gynecological examination (general) (routine) without abnormal findings: Secondary | ICD-10-CM | POA: Diagnosis not present

## 2019-11-29 DIAGNOSIS — Z6821 Body mass index (BMI) 21.0-21.9, adult: Secondary | ICD-10-CM | POA: Diagnosis not present

## 2019-11-29 DIAGNOSIS — N762 Acute vulvitis: Secondary | ICD-10-CM | POA: Diagnosis not present

## 2019-12-15 ENCOUNTER — Other Ambulatory Visit: Payer: Self-pay | Admitting: Internal Medicine

## 2019-12-15 DIAGNOSIS — Z1231 Encounter for screening mammogram for malignant neoplasm of breast: Secondary | ICD-10-CM

## 2019-12-19 ENCOUNTER — Other Ambulatory Visit: Payer: Self-pay

## 2019-12-19 ENCOUNTER — Ambulatory Visit
Admission: RE | Admit: 2019-12-19 | Discharge: 2019-12-19 | Disposition: A | Discharge status: Home / Self Care | Payer: BC Managed Care – PPO | Source: Ambulatory Visit | Attending: Internal Medicine | Admitting: Internal Medicine

## 2019-12-19 DIAGNOSIS — Z1231 Encounter for screening mammogram for malignant neoplasm of breast: Secondary | ICD-10-CM

## 2020-03-06 DIAGNOSIS — R102 Pelvic and perineal pain: Secondary | ICD-10-CM | POA: Diagnosis not present

## 2020-03-06 DIAGNOSIS — N950 Postmenopausal bleeding: Secondary | ICD-10-CM | POA: Diagnosis not present

## 2020-03-16 DIAGNOSIS — N950 Postmenopausal bleeding: Secondary | ICD-10-CM | POA: Diagnosis not present

## 2020-03-16 DIAGNOSIS — K602 Anal fissure, unspecified: Secondary | ICD-10-CM | POA: Diagnosis not present

## 2020-03-16 DIAGNOSIS — N766 Ulceration of vulva: Secondary | ICD-10-CM | POA: Diagnosis not present

## 2020-04-03 DIAGNOSIS — N950 Postmenopausal bleeding: Secondary | ICD-10-CM | POA: Diagnosis not present

## 2020-08-14 DIAGNOSIS — H5213 Myopia, bilateral: Secondary | ICD-10-CM | POA: Diagnosis not present

## 2020-08-14 DIAGNOSIS — H524 Presbyopia: Secondary | ICD-10-CM | POA: Diagnosis not present

## 2020-09-21 DIAGNOSIS — Z0000 Encounter for general adult medical examination without abnormal findings: Secondary | ICD-10-CM | POA: Diagnosis not present

## 2020-09-21 DIAGNOSIS — E785 Hyperlipidemia, unspecified: Secondary | ICD-10-CM | POA: Diagnosis not present

## 2020-09-28 DIAGNOSIS — Z23 Encounter for immunization: Secondary | ICD-10-CM | POA: Diagnosis not present

## 2020-09-28 DIAGNOSIS — R82998 Other abnormal findings in urine: Secondary | ICD-10-CM | POA: Diagnosis not present

## 2020-09-28 DIAGNOSIS — E785 Hyperlipidemia, unspecified: Secondary | ICD-10-CM | POA: Diagnosis not present

## 2020-09-28 DIAGNOSIS — Z0000 Encounter for general adult medical examination without abnormal findings: Secondary | ICD-10-CM | POA: Diagnosis not present

## 2020-09-28 DIAGNOSIS — Z1331 Encounter for screening for depression: Secondary | ICD-10-CM | POA: Diagnosis not present

## 2021-02-04 DIAGNOSIS — Z6821 Body mass index (BMI) 21.0-21.9, adult: Secondary | ICD-10-CM | POA: Diagnosis not present

## 2021-02-04 DIAGNOSIS — A609 Anogenital herpesviral infection, unspecified: Secondary | ICD-10-CM | POA: Diagnosis not present

## 2021-02-04 DIAGNOSIS — Z01419 Encounter for gynecological examination (general) (routine) without abnormal findings: Secondary | ICD-10-CM | POA: Diagnosis not present

## 2021-05-15 ENCOUNTER — Other Ambulatory Visit: Payer: Self-pay | Admitting: Internal Medicine

## 2021-05-15 ENCOUNTER — Other Ambulatory Visit: Payer: Self-pay

## 2021-05-15 ENCOUNTER — Ambulatory Visit
Admission: RE | Admit: 2021-05-15 | Discharge: 2021-05-15 | Disposition: A | Discharge status: Home / Self Care | Payer: BC Managed Care – PPO | Source: Ambulatory Visit | Attending: Internal Medicine | Admitting: Internal Medicine

## 2021-05-15 DIAGNOSIS — Z1231 Encounter for screening mammogram for malignant neoplasm of breast: Secondary | ICD-10-CM | POA: Diagnosis not present

## 2021-11-07 DIAGNOSIS — E785 Hyperlipidemia, unspecified: Secondary | ICD-10-CM | POA: Diagnosis not present

## 2021-11-14 DIAGNOSIS — Z1212 Encounter for screening for malignant neoplasm of rectum: Secondary | ICD-10-CM | POA: Diagnosis not present

## 2021-11-14 DIAGNOSIS — Z0000 Encounter for general adult medical examination without abnormal findings: Secondary | ICD-10-CM | POA: Diagnosis not present

## 2021-11-14 DIAGNOSIS — Z5181 Encounter for therapeutic drug level monitoring: Secondary | ICD-10-CM | POA: Diagnosis not present

## 2021-11-14 DIAGNOSIS — G5601 Carpal tunnel syndrome, right upper limb: Secondary | ICD-10-CM | POA: Diagnosis not present

## 2021-11-14 DIAGNOSIS — Z1331 Encounter for screening for depression: Secondary | ICD-10-CM | POA: Diagnosis not present

## 2021-11-14 DIAGNOSIS — Z1339 Encounter for screening examination for other mental health and behavioral disorders: Secondary | ICD-10-CM | POA: Diagnosis not present

## 2021-11-18 ENCOUNTER — Other Ambulatory Visit: Payer: Self-pay | Admitting: Internal Medicine

## 2021-11-18 DIAGNOSIS — Z803 Family history of malignant neoplasm of breast: Secondary | ICD-10-CM

## 2021-12-10 ENCOUNTER — Ambulatory Visit
Admission: RE | Admit: 2021-12-10 | Discharge: 2021-12-10 | Disposition: A | Discharge status: Home / Self Care | Payer: No Typology Code available for payment source | Source: Ambulatory Visit | Attending: Internal Medicine | Admitting: Internal Medicine

## 2021-12-10 DIAGNOSIS — Z1239 Encounter for other screening for malignant neoplasm of breast: Secondary | ICD-10-CM | POA: Diagnosis not present

## 2021-12-10 DIAGNOSIS — Z803 Family history of malignant neoplasm of breast: Secondary | ICD-10-CM

## 2021-12-10 MED ORDER — GADOBUTROL 1 MMOL/ML IV SOLN
7.0000 mL | Freq: Once | INTRAVENOUS | Status: AC | PRN
  Administered 2021-12-10: 7 mL via INTRAVENOUS

## 2022-07-12 IMAGING — MR MR BREAST WO/W CM  BILAT
5 series · 30 of 48 positions shown · IV contrast (7 ml gadavist)
Comparison: No previous breast MRI.

CLINICAL DATA: Abbreviated Breast MRI for breast cancer screening.

LABS:  Not performed at imaging site.
EXAM:
BILATERAL ABBREVIATED BREAST MRI WITH AND WITHOUT CONTRAST
TECHNIQUE: Multiplanar, multisequence MR images of both breasts were obtained
prior to and following the intravenous administration of 7 ml of
Gadavist

[Series 2: t2_tirm_tra ipat (a-p) · axial · 3.0mm · 0.70mm/px · z∈[-101,+55]mm · 5 of 53 slices shown]
[im 1/53]
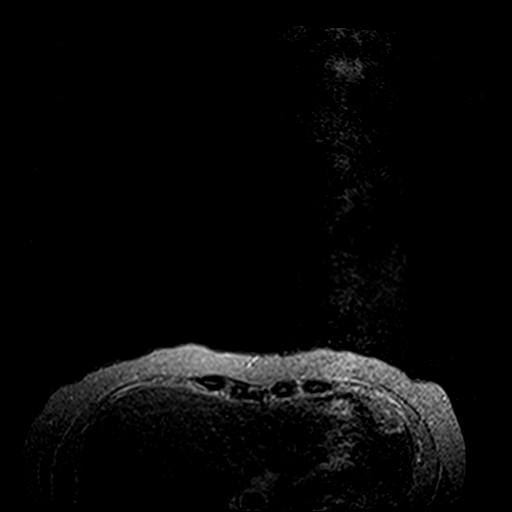
[im 14/53]
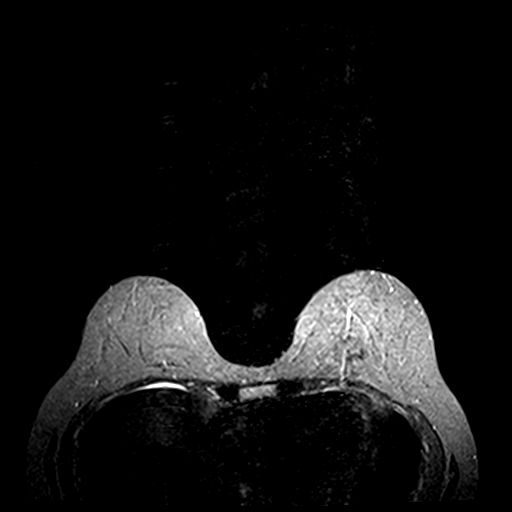
[im 27/53]
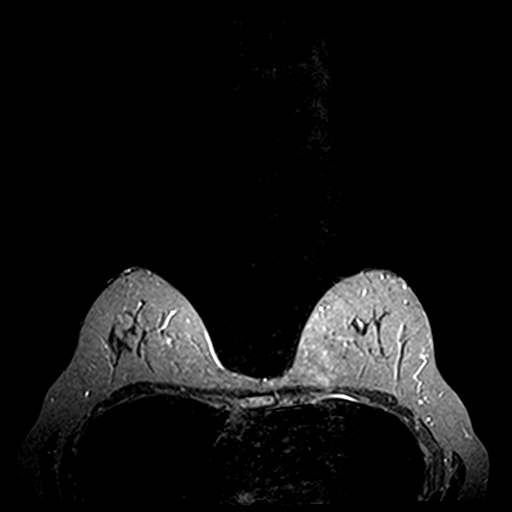
[im 40/53]
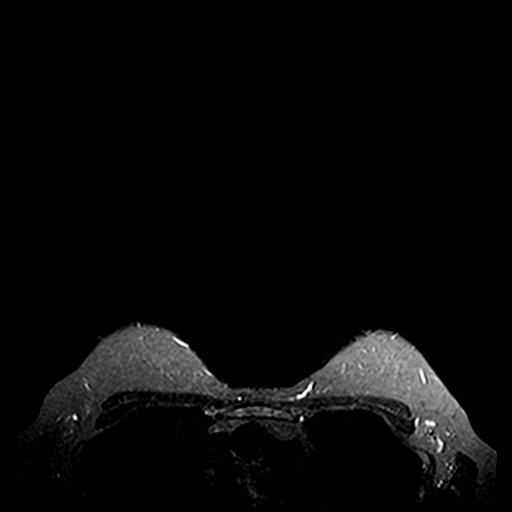
[im 53/53]
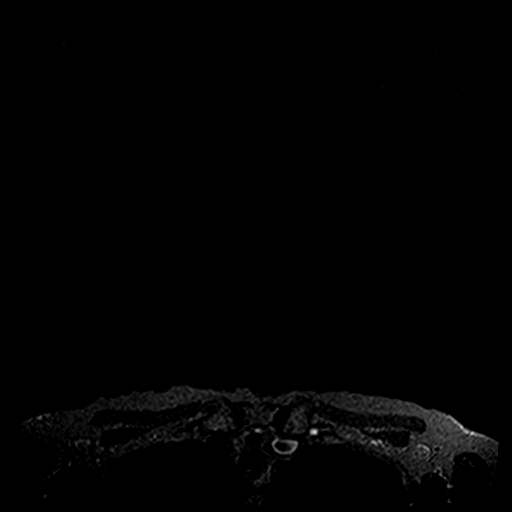

[Series 3: fl3d pre-cm · axial · non-contrast · 1.2mm · 0.94mm/px · z∈[-108,+63]mm · 8 of 144 slices shown]
[im 1/144]
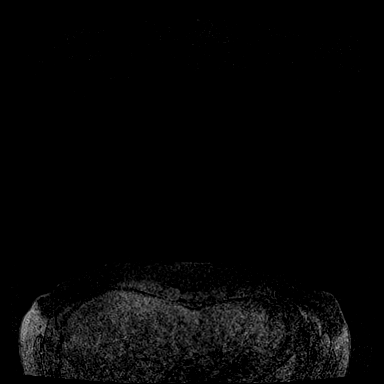
[im 23/144]
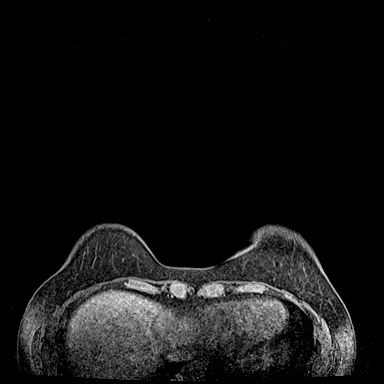
[im 45/144]
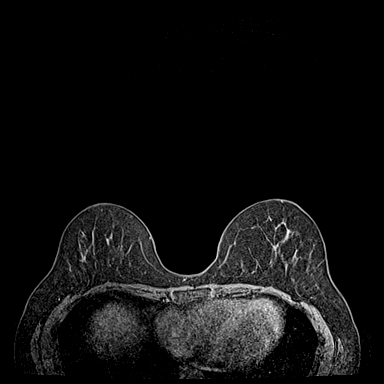
[im 67/144]
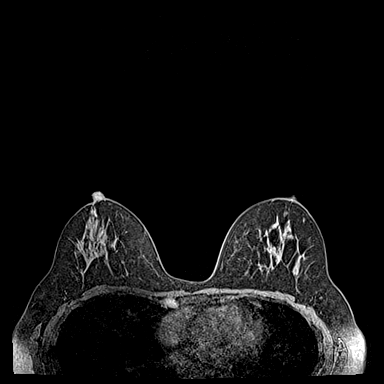
[im 78/144]
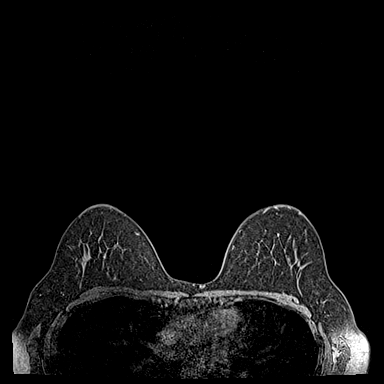
[im 100/144]
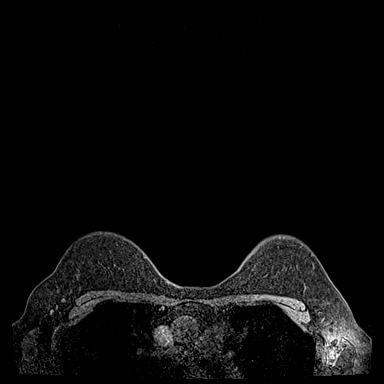
[im 122/144]
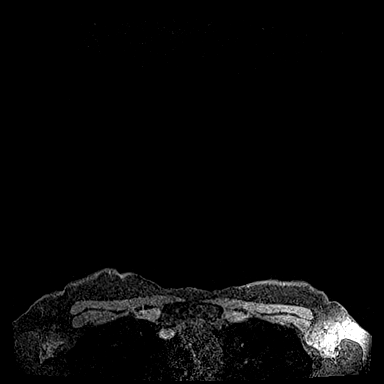
[im 144/144]
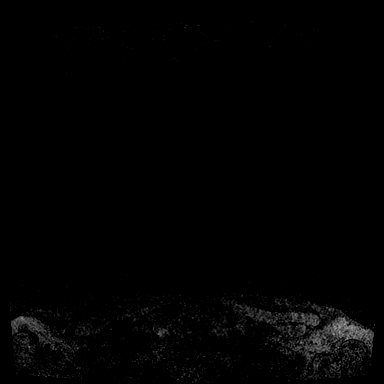

[Series 4: fl3d post-cm 20 · axial · 1.2mm · 0.94mm/px · z∈[-108,+63]mm · 8 of 144 slices shown (1 of 3)]
[im 1/144]
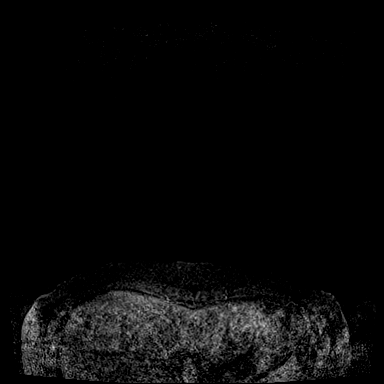
[im 23/144]
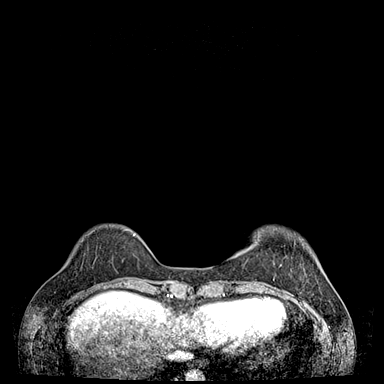
[im 45/144]
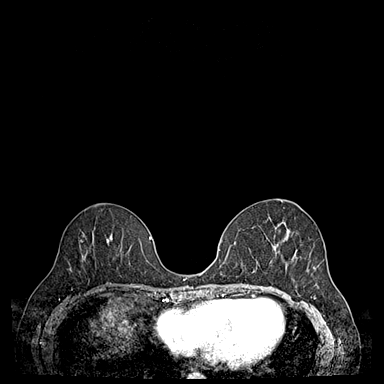
[im 67/144]
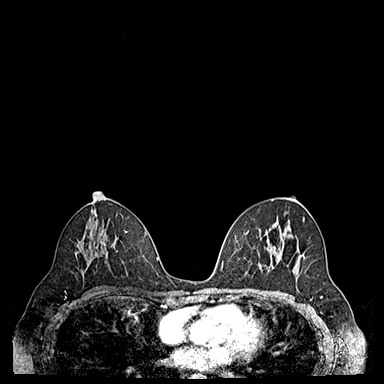
[im 78/144]
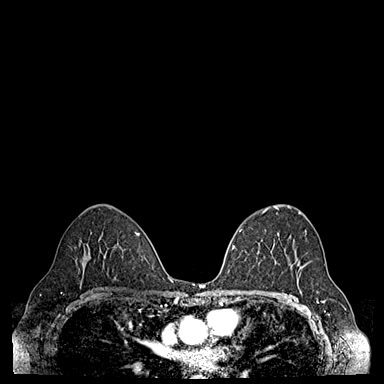
[im 100/144]
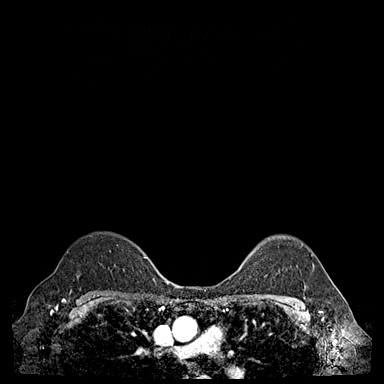
[im 122/144]
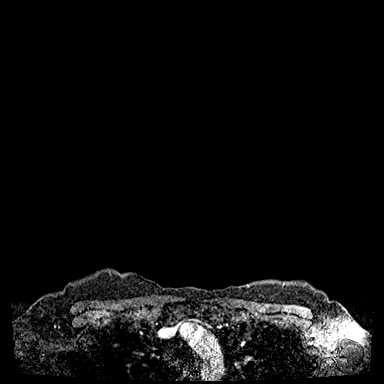
[im 144/144]
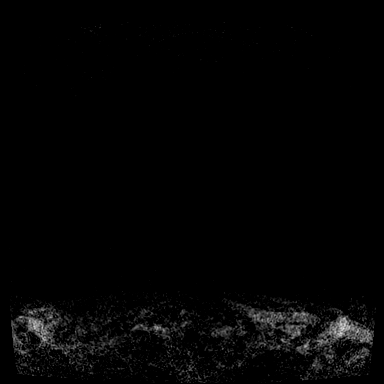

[Series 5: fl3d post-cm 20 · axial · 1.2mm · 0.94mm/px · z∈[-108,+63]mm · 8 of 144 slices shown (2 of 3)]
[im 1/144]
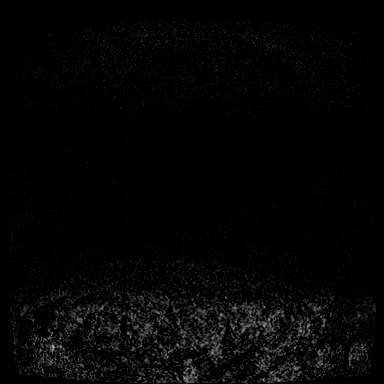
[im 23/144]
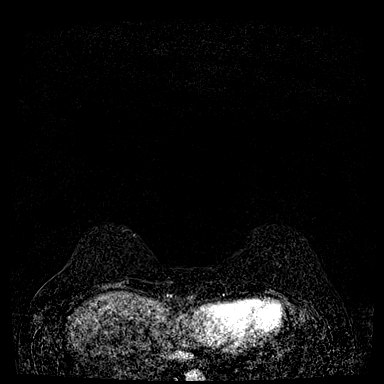
[im 45/144]
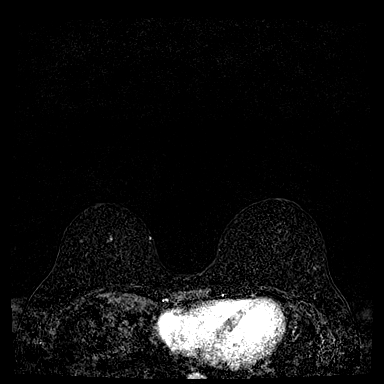
[im 67/144]
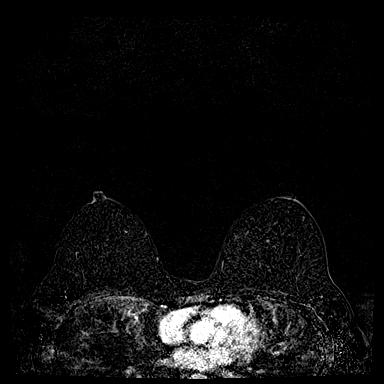
[im 78/144]
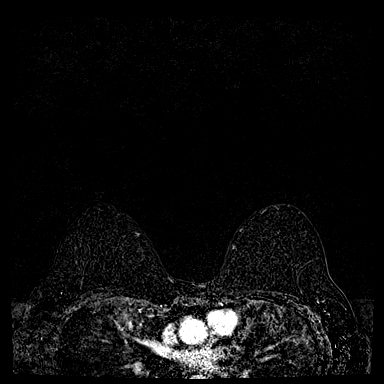
[im 100/144]
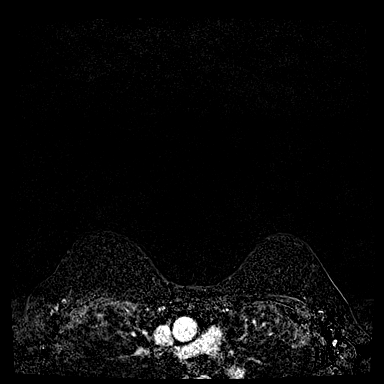
[im 122/144]
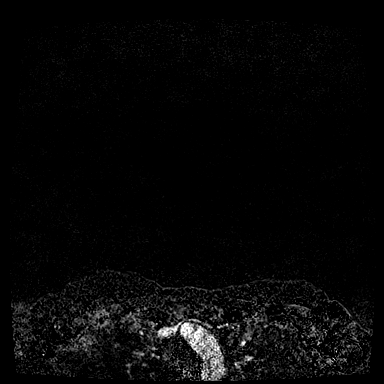
[im 144/144]
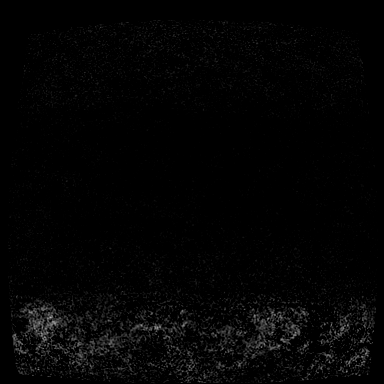

[Series 6: fl3d post-cm 20 · axial · 172.8mm · 0.94mm/px · 1 of 1 slices shown (3 of 3)]
[im 1/1]
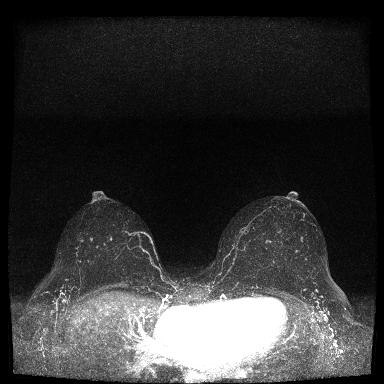

[30 of 48 positions shown; findings below may reference images not displayed]

Three-dimensional MR images were rendered by post-processing of the
original MR data on an independent workstation. The
three-dimensional MR images were interpreted, and findings are
reported in the following complete MRI report for this study. Three
dimensional images were evaluated at the independent [employer]
workstation
FINDINGS: Breast composition: c. Heterogeneous fibroglandular tissue.

Background parenchymal enhancement: Mild

Right breast: No suspicious enhancing mass, suspicious non-mass
enhancement or secondary signs of malignancy.

Left breast: No suspicious enhancing mass, suspicious non-mass
enhancement or secondary signs of malignancy.

Lymph nodes: No abnormal appearing lymph nodes.

Ancillary findings:  None.
IMPRESSION: No evidence of malignancy within either breast.

RECOMMENDATION:
Recommend annual screening mammography. The patient is also eligible
for annual Abbreviated Breast MRI if she wishes.

BI-RADS CATEGORY  1: Negative.

## 2022-09-10 DIAGNOSIS — Z6822 Body mass index (BMI) 22.0-22.9, adult: Secondary | ICD-10-CM | POA: Diagnosis not present

## 2022-09-10 DIAGNOSIS — Z803 Family history of malignant neoplasm of breast: Secondary | ICD-10-CM | POA: Diagnosis not present

## 2022-09-10 DIAGNOSIS — Z8042 Family history of malignant neoplasm of prostate: Secondary | ICD-10-CM | POA: Diagnosis not present

## 2022-09-10 DIAGNOSIS — Z124 Encounter for screening for malignant neoplasm of cervix: Secondary | ICD-10-CM | POA: Diagnosis not present

## 2022-09-10 DIAGNOSIS — Z01419 Encounter for gynecological examination (general) (routine) without abnormal findings: Secondary | ICD-10-CM | POA: Diagnosis not present

## 2022-10-16 ENCOUNTER — Other Ambulatory Visit: Payer: Self-pay | Admitting: Internal Medicine

## 2022-10-16 DIAGNOSIS — Z1231 Encounter for screening mammogram for malignant neoplasm of breast: Secondary | ICD-10-CM

## 2022-10-17 ENCOUNTER — Ambulatory Visit
Admission: RE | Admit: 2022-10-17 | Discharge: 2022-10-17 | Disposition: A | Discharge status: Home / Self Care | Payer: BC Managed Care – PPO | Source: Ambulatory Visit | Attending: Internal Medicine | Admitting: Internal Medicine

## 2022-10-17 DIAGNOSIS — Z1231 Encounter for screening mammogram for malignant neoplasm of breast: Secondary | ICD-10-CM | POA: Diagnosis not present

## 2022-10-24 DIAGNOSIS — R21 Rash and other nonspecific skin eruption: Secondary | ICD-10-CM | POA: Diagnosis not present

## 2022-10-24 DIAGNOSIS — Z9189 Other specified personal risk factors, not elsewhere classified: Secondary | ICD-10-CM | POA: Diagnosis not present

## 2022-11-07 ENCOUNTER — Telehealth (HOSPITAL_COMMUNITY): Payer: Self-pay

## 2022-11-10 ENCOUNTER — Inpatient Hospital Stay: Payer: BC Managed Care – PPO | Attending: Hematology and Oncology | Admitting: Hematology and Oncology

## 2022-11-10 ENCOUNTER — Other Ambulatory Visit: Payer: Self-pay

## 2022-11-10 ENCOUNTER — Encounter: Payer: Self-pay | Admitting: Hematology and Oncology

## 2022-11-10 VITALS — BP 130/77 | HR 66 | Temp 98.1°F | Resp 16 | Ht 67.0 in | Wt 141.8 lb

## 2022-11-10 DIAGNOSIS — Z8042 Family history of malignant neoplasm of prostate: Secondary | ICD-10-CM | POA: Diagnosis not present

## 2022-11-10 DIAGNOSIS — Z79899 Other long term (current) drug therapy: Secondary | ICD-10-CM | POA: Diagnosis not present

## 2022-11-10 DIAGNOSIS — Z87891 Personal history of nicotine dependence: Secondary | ICD-10-CM

## 2022-11-10 DIAGNOSIS — Z9189 Other specified personal risk factors, not elsewhere classified: Secondary | ICD-10-CM | POA: Diagnosis not present

## 2022-11-10 DIAGNOSIS — Z803 Family history of malignant neoplasm of breast: Secondary | ICD-10-CM | POA: Diagnosis not present

## 2022-11-10 NOTE — Progress Notes (Signed)
[facility] CONSULT NOTE  Patient Care Team: Crist Infante, MD as PCP - General (Internal Medicine)  CHIEF COMPLAINTS/PURPOSE OF CONSULTATION:  At high risk for breast cancer  HISTORY OF PRESENTING ILLNESS:   Adriana Simpson 52 y.o. female is here because of increased risk of breast cancer.  This is a very pleasant 52 year old female patient with family history significant for breast cancer in mother, maternal grandmother, maternal aunt, paternal grandmother and father with prostate cancer who was thought to have increased lifetime risk of breast cancer and hence referred to high risk breast clinic. She had MRI breast back in Feb 2023 with no evidence of malignancy with in either breast. She had mammogram in January 2024 which showed no evidence of malignancy, breast density B. She does report family history of breast cancer on both sides, more significant on mom side, age of onset over 72 and most family members.  She is an attorney by occupation, denies any other major medical issues.  She is active, denies any use of hormone replacement therapy, regular alcohol intake.  She has 3 kids, age at first childbirth is 63 years.  Rest of the pertinent 10 point ROS reviewed and negative  SUMMARY OF ONCOLOGIC HISTORY: Oncology History   No history exists.     MEDICAL HISTORY:  Past Medical History:  Diagnosis Date   Allergic rhinitis    Allergy    Anal fissure 2001   Anxiety    Gallstones 2001   History of colonic diverticulitis    March 2020, April 2020    SURGICAL HISTORY: Past Surgical History:  Procedure Laterality Date   CERVICAL DISC SURGERY     CHOLECYSTECTOMY  2001   WISDOM TOOTH EXTRACTION      SOCIAL HISTORY: Social History   Socioeconomic History   Marital status: Married    Spouse name: Not on file   Number of children: 3   Years of education: Not on file   Highest education level: Not on file  Occupational History   Occupation: minister    Occupation: attorney  Tobacco Use   Smoking status: Former    Years: 3.00    Types: Cigarettes    Quit date: 07/31/1994    Years since quitting: 28.2   Smokeless tobacco: Never   Tobacco comments:    in college  Vaping Use   Vaping Use: Never used  Substance and Sexual Activity   Alcohol use: Yes    Alcohol/week: 7.0 standard drinks of alcohol    Types: 7 Standard drinks or equivalent per week    Comment: 1 per week   Drug use: No   Sexual activity: Not on file  Other Topics Concern   Not on file  Social History Narrative   Not on file   Social Determinants of Health   Financial Resource Strain: Not on file  Food Insecurity: Not on file  Transportation Needs: Not on file  Physical Activity: Not on file  Stress: Not on file  Social Connections: Not on file  Intimate Partner Violence: Not on file    FAMILY HISTORY: Family History  Problem Relation Age of Onset   Breast cancer Mother 43   Other Father        nerve tumor   Breast cancer Maternal Aunt    Breast cancer Maternal Grandmother    Breast cancer Paternal Grandmother    Tuberculosis Brother    Other Son        nerve tumor  Colon cancer Neg Hx    Esophageal cancer Neg Hx    Rectal cancer Neg Hx    Stomach cancer Neg Hx     ALLERGIES:  is allergic to codeine.  MEDICATIONS:  Current Outpatient Medications  Medication Sig Dispense Refill   valACYclovir (VALTREX) 500 MG tablet Take 500 mg by mouth 2 (two) times daily.     ibuprofen (ADVIL,MOTRIN) 200 MG tablet Take 200 mg by mouth every 6 (six) hours as needed.     loratadine (CLARITIN) 10 MG tablet Take 10 mg by mouth daily.     Multiple Vitamin (MULTIVITAMIN) tablet Take 1 tablet by mouth daily.     sertraline (ZOLOFT) 100 MG tablet Take 100 mg by mouth daily.     triamcinolone (NASACORT ALLERGY 24HR) 55 MCG/ACT AERO nasal inhaler Place 2 sprays into the nose as needed.     No current facility-administered medications for this visit.    REVIEW OF  SYSTEMS:   Constitutional: Denies fevers, chills or abnormal night sweats Eyes: Denies blurriness of vision, double vision or watery eyes Ears, nose, mouth, throat, and face: Denies mucositis or sore throat Respiratory: Denies cough, dyspnea or wheezes Cardiovascular: Denies palpitation, chest discomfort or lower extremity swelling Gastrointestinal:  Denies nausea, heartburn or change in bowel habits Skin: Denies abnormal skin rashes Lymphatics: Denies new lymphadenopathy or easy bruising Neurological:Denies numbness, tingling or new weaknesses Behavioral/Psych: Mood is stable, no new changes  Breast: Denies any palpable lumps or discharge All other systems were reviewed with the patient and are negative.  PHYSICAL EXAMINATION: ECOG PERFORMANCE STATUS: 0 - Asymptomatic  Vitals:   11/10/22 1039  BP: 130/77  Pulse: 66  Resp: 16  Temp: 98.1 F (36.7 C)  SpO2: 100%   Filed Weights   11/10/22 1039  Weight: 141 lb 12.8 oz (64.3 kg)    GENERAL:alert, no distress and comfortable NECK: supple, thyroid normal size, non-tender, without nodularity BREAST: Bilateral breasts inspected and palpated.  No palpable masses or regional adenopathy  LABORATORY DATA:  I have reviewed the data as listed Lab Results  Component Value Date   WBC 4.9 07/31/2014   HGB 13.4 07/31/2014   HCT 40.0 07/31/2014   MCV 95.2 07/31/2014   PLT 180.0 07/31/2014   Lab Results  Component Value Date   NA 139 07/31/2014   K 4.4 07/31/2014   CL 105 07/31/2014   CO2 30 07/31/2014    RADIOGRAPHIC STUDIES: I have personally reviewed the radiological reports and agreed with the findings in the report.  ASSESSMENT AND PLAN:  At high risk for breast cancer This is a very pleasant 52 year old female patient with family history significant for breast cancer in mother, maternal aunt, maternal grandmother, paternal grandmother, genetic testing negative referred to high risk breast clinic given her increased  lifetime risk of breast cancer.  I have reviewed the myriad score which did not show any evidence of pathogenic gene mutations.  Based on myriad score, lifetime risk was calculated at greater than 30% however upon review of details and recalculating the score based on Tyrer-Cuzick model today, her risk is just shy of 20% Our geneticist has also reviewed this and agrees with the calculated lifetime risk of breast cancer just under 20%.  We have today reviewed about role of MRI screening as well as mammogram screening for people who are at high risk of breast cancer.  She is very much interested in considering MRI screening.  We have discussed about unknown long-term risk of gadolinium  deposition and increased density of MRIs leading to biopsies.  After detailed discussion, she is agreeable to considering MRI every other year.  I think this is reasonable.  We have also discussed about lifestyle changes including regular exercise, plant-based diet, reduce alcohol consumption, avoiding hormone replacement therapy etc.  She expressed understanding of all the recommendations.  We have discussed about using tamoxifen in women who have 5-year risk of breast cancer greater than 1.7%.  Her 5-year risk of breast cancer is around 2.1%.  I have discussed about details of tamoxifen, aromatase inhibitors, reducing the incidence of ER positive breast cancer but no benefit on survival.  Since her 5-year risk is very close to the average 5-year risk of breast cancer at this age, she will think about it.  She will return to clinic in 1 year or sooner as needed.  Self breast exam recommended.  Thank you for consulting [place] in the care of this patient.  Please not hesitate to contact us with any additional questions or concerns.  Total time spent: 45 minutes including history, physical exam, review of records, counseling and coordination of care All questions were answered. The patient knows to call the clinic with any  problems, questions or concerns.    Benay Pike, MD 11/10/22

## 2022-11-10 NOTE — Assessment & Plan Note (Signed)
This is a very pleasant 52 year old female patient with family history significant for breast cancer in mother, maternal aunt, maternal grandmother, paternal grandmother, genetic testing negative referred to high risk breast clinic given her increased lifetime risk of breast cancer.  I have reviewed the myriad score which did not show any evidence of pathogenic gene mutations.  Based on myriad score, lifetime risk was calculated at greater than 30% however upon review of details and recalculating the score based on Tyrer-Cuzick model today, her risk is just shy of 20% Our geneticist has also reviewed this and agrees with the calculated lifetime risk of breast cancer just under 20%.  We have today reviewed about role of MRI screening as well as mammogram screening for people who are at high risk of breast cancer.  She is very much interested in considering MRI screening.  We have discussed about unknown long-term risk of gadolinium deposition and increased density of MRIs leading to biopsies.  After detailed discussion, she is agreeable to considering MRI every other year.  I think this is reasonable.  We have also discussed about lifestyle changes including regular exercise, plant-based diet, reduce alcohol consumption, avoiding hormone replacement therapy etc.  She expressed understanding of all the recommendations.  We have discussed about using tamoxifen in women who have 5-year risk of breast cancer greater than 1.7%.  Her 5-year risk of breast cancer is around 2.1%.  I have discussed about details of tamoxifen, aromatase inhibitors, reducing the incidence of ER positive breast cancer but no benefit on survival.  Since her 5-year risk is very close to the average 5-year risk of breast cancer at this age, she will think about it.  She will return to clinic in 1 year or sooner as needed.  Self breast exam recommended.  Thank you for consulting [place] in the care of this patient.  Please not hesitate to contact  us with any additional questions or concerns.

## 2022-11-27 DIAGNOSIS — E785 Hyperlipidemia, unspecified: Secondary | ICD-10-CM | POA: Diagnosis not present

## 2022-11-27 DIAGNOSIS — R7989 Other specified abnormal findings of blood chemistry: Secondary | ICD-10-CM | POA: Diagnosis not present

## 2022-11-27 DIAGNOSIS — Z1212 Encounter for screening for malignant neoplasm of rectum: Secondary | ICD-10-CM | POA: Diagnosis not present

## 2022-11-27 DIAGNOSIS — F419 Anxiety disorder, unspecified: Secondary | ICD-10-CM | POA: Diagnosis not present

## 2022-12-03 DIAGNOSIS — R21 Rash and other nonspecific skin eruption: Secondary | ICD-10-CM | POA: Diagnosis not present

## 2022-12-03 DIAGNOSIS — L720 Epidermal cyst: Secondary | ICD-10-CM | POA: Diagnosis not present

## 2022-12-03 DIAGNOSIS — B009 Herpesviral infection, unspecified: Secondary | ICD-10-CM | POA: Diagnosis not present

## 2022-12-04 DIAGNOSIS — E785 Hyperlipidemia, unspecified: Secondary | ICD-10-CM | POA: Diagnosis not present

## 2022-12-04 DIAGNOSIS — G5601 Carpal tunnel syndrome, right upper limb: Secondary | ICD-10-CM | POA: Diagnosis not present

## 2022-12-04 DIAGNOSIS — Z1331 Encounter for screening for depression: Secondary | ICD-10-CM | POA: Diagnosis not present

## 2022-12-04 DIAGNOSIS — Z23 Encounter for immunization: Secondary | ICD-10-CM | POA: Diagnosis not present

## 2022-12-04 DIAGNOSIS — Z0000 Encounter for general adult medical examination without abnormal findings: Secondary | ICD-10-CM | POA: Diagnosis not present

## 2022-12-04 DIAGNOSIS — J309 Allergic rhinitis, unspecified: Secondary | ICD-10-CM | POA: Diagnosis not present

## 2022-12-04 DIAGNOSIS — F419 Anxiety disorder, unspecified: Secondary | ICD-10-CM | POA: Diagnosis not present

## 2022-12-23 DIAGNOSIS — L738 Other specified follicular disorders: Secondary | ICD-10-CM | POA: Diagnosis not present

## 2023-09-30 DIAGNOSIS — Z6821 Body mass index (BMI) 21.0-21.9, adult: Secondary | ICD-10-CM | POA: Diagnosis not present

## 2023-09-30 DIAGNOSIS — Z01419 Encounter for gynecological examination (general) (routine) without abnormal findings: Secondary | ICD-10-CM | POA: Diagnosis not present

## 2023-11-12 ENCOUNTER — Encounter: Payer: Self-pay | Admitting: Hematology and Oncology

## 2023-11-12 ENCOUNTER — Inpatient Hospital Stay: Payer: BC Managed Care – PPO | Attending: Hematology and Oncology | Admitting: Hematology and Oncology

## 2023-11-12 VITALS — BP 117/64 | HR 70 | Temp 98.2°F | Resp 16 | Wt 139.1 lb

## 2023-11-12 DIAGNOSIS — Z9189 Other specified personal risk factors, not elsewhere classified: Secondary | ICD-10-CM | POA: Diagnosis not present

## 2023-11-12 DIAGNOSIS — Z8042 Family history of malignant neoplasm of prostate: Secondary | ICD-10-CM | POA: Insufficient documentation

## 2023-11-12 DIAGNOSIS — Z79899 Other long term (current) drug therapy: Secondary | ICD-10-CM | POA: Diagnosis not present

## 2023-11-12 DIAGNOSIS — Z803 Family history of malignant neoplasm of breast: Secondary | ICD-10-CM | POA: Insufficient documentation

## 2023-11-12 NOTE — Assessment & Plan Note (Signed)
This is a very pleasant 53 year old female patient with family history significant for breast cancer in mother, maternal aunt, maternal grandmother, paternal grandmother, genetic testing negative referred to high risk breast clinic given her increased lifetime risk of breast cancer.  I have reviewed the myriad score which did not show any evidence of pathogenic gene mutations.  Based on myriad score, lifetime risk was calculated at greater than 30% however upon review of details and recalculating the score based on Tyrer-Cuzick model today, her risk is just shy of 20% Our geneticist has also reviewed this and agrees with the calculated lifetime risk of breast cancer just under 20%.  Breast Cancer Risk Lifetime risk just under 20%. Last MRI was in February 2023. No changes noted in self-breast exam. Normal breast exam today. -Order MRI now, as it has been 2 years since the last one. -Plan to alternate MRI and mammogram every year, with the next mammogram in August 2025. -Consider reevaluating tamoxifen use in the future, as the current 5-year risk is 2.1%.  General Health Maintenance Discussed the importance of lifestyle factors such as social interaction, sleep, and stress management in overall health and cancer risk. -Encourage continued physical activity. -Self breast exam monthly, follow-up with PCP in approximately 6 months from now and return to clinic for follow-up with [place] in 1 year.  Medication Update Patient is taking L-lysine supplement for fever blisters and has stopped taking Valtrex. -Continue current regimen as it is effective.

## 2023-11-12 NOTE — Progress Notes (Signed)
[facility] CONSULT NOTE  Patient Care Team: Rodrigo Ran, MD as PCP - General (Internal Medicine)  CHIEF COMPLAINTS/PURPOSE OF CONSULTATION:  At high risk for breast cancer  HISTORY OF PRESENTING ILLNESS:   Adriana Simpson 53 y.o. female is here because of increased risk of breast cancer.  This is a very pleasant 53 year old female patient with family history significant for breast cancer in mother, maternal grandmother, maternal aunt, paternal grandmother and father with prostate cancer who was thought to have increased lifetime risk of breast cancer and hence referred to high risk breast clinic. Her liftime risk of BC is just about 20%, her 5-year risk of breast cancer based on Gail's model is approximately 2.1%.  During her last visit, she has agreed to MRI every other year and mammogram annually.  Discussed the use of AI scribe software for clinical note transcription with the patient, who gave verbal consent to proceed.  History of Present Illness         The patient presents for follow-up regarding breast cancer screening and family history of breast cancer.  She had an MRI in February 2023 and a mammogram around the same time. She is due for another MRI as it has been two years since the last one. There was a previous order for an MRI placed in January 2024, but it expired recently without being scheduled. She has not been contacted by the breast center regarding this or she might have missed the call.  She has a significant family history of breast cancer, including her mother, maternal grandmother, maternal aunt, and paternal grandmother. Her mother was the youngest diagnosed at age 62 or 2. She has previously discussed the use of tamoxifen but decided against it due to the small benefit and lack of change in survival rates.  No changes in her breasts and no swelling in her legs. She is currently taking L-lysine for fever blisters, which she finds effective, and has  stopped taking Valtrex as it was no longer working. She experienced a severe outbreak of fever blisters on her face, which is now under control with L-lysine and essential oils.  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 Rest of the pertinent 10 point ROS reviewed and negative  SUMMARY OF ONCOLOGIC HISTORY: Oncology History   No history exists.     MEDICAL HISTORY:  Past Medical History:  Diagnosis Date   Allergic rhinitis    Allergy    Anal fissure 2001   Anxiety    Gallstones 2001   History of colonic diverticulitis    March 2020, April 2020    SURGICAL HISTORY: Past Surgical History:  Procedure Laterality Date   CERVICAL DISC SURGERY     CHOLECYSTECTOMY  2001   WISDOM TOOTH EXTRACTION      SOCIAL HISTORY: Social History   Socioeconomic History   Marital status: Married    Spouse name: Not on file   Number of children: 3   Years of education: Not on file   Highest education level: Not on file  Occupational History   Occupation: minister   Occupation: attorney  Tobacco Use   Smoking status: Former    Current packs/day: 0.00    Types: Cigarettes    Start date: 08/01/1991    Quit date: 07/31/1994    Years since quitting: 29.3   Smokeless tobacco: Never   Tobacco comments:    in college  Vaping Use   Vaping status: Never Used  Substance and  Sexual Activity   Alcohol use: Yes    Alcohol/week: 7.0 standard drinks of alcohol    Types: 7 Standard drinks or equivalent per week    Comment: 1 per week   Drug use: No   Sexual activity: Not on file  Other Topics Concern   Not on file  Social History Narrative   Not on file   Social Drivers of Health   Financial Resource Strain: Not on file  Food Insecurity: Not on file  Transportation Needs: Not on file  Physical Activity: Not on file  Stress: Not on file  Social Connections: Not on file  Intimate Partner Violence: Not on file    FAMILY HISTORY: Family History  Problem Relation Age of Onset    Breast cancer Mother 49   Other Father        nerve tumor   Breast cancer Maternal Aunt    Breast cancer Maternal Grandmother    Breast cancer Paternal Grandmother    Tuberculosis Brother    Other Son        nerve tumor   Colon cancer Neg Hx    Esophageal cancer Neg Hx    Rectal cancer Neg Hx    Stomach cancer Neg Hx     ALLERGIES:  is allergic to codeine.  MEDICATIONS:  Current Outpatient Medications  Medication Sig Dispense Refill   ibuprofen (ADVIL,MOTRIN) 200 MG tablet Take 200 mg by mouth every 6 (six) hours as needed.     loratadine (CLARITIN) 10 MG tablet Take 10 mg by mouth daily.     Multiple Vitamin (MULTIVITAMIN) tablet Take 1 tablet by mouth daily.     sertraline (ZOLOFT) 100 MG tablet Take 100 mg by mouth daily.     triamcinolone (NASACORT ALLERGY 24HR) 55 MCG/ACT AERO nasal inhaler Place 2 sprays into the nose as needed.     valACYclovir (VALTREX) 500 MG tablet Take 500 mg by mouth 2 (two) times daily.     No current facility-administered medications for this visit.    REVIEW OF SYSTEMS:   Constitutional: Denies fevers, chills or abnormal night sweats Eyes: Denies blurriness of vision, double vision or watery eyes Ears, nose, mouth, throat, and face: Denies mucositis or sore throat Respiratory: Denies cough, dyspnea or wheezes Cardiovascular: Denies palpitation, chest discomfort or lower extremity swelling Gastrointestinal:  Denies nausea, heartburn or change in bowel habits Skin: Denies abnormal skin rashes Lymphatics: Denies new lymphadenopathy or easy bruising Neurological:Denies numbness, tingling or new weaknesses Behavioral/Psych: Mood is stable, no new changes  Breast: Denies any palpable lumps or discharge All other systems were reviewed with the patient and are negative.  PHYSICAL EXAMINATION: ECOG PERFORMANCE STATUS: 0 - Asymptomatic  Vitals:   11/12/23 0845  BP: 117/64  Pulse: 70  Resp: 16  Temp: 98.2 F (36.8 C)  SpO2: 99%   Filed  Weights   11/12/23 0845  Weight: 139 lb 1.6 oz (63.1 kg)    GENERAL:alert, no distress and comfortable NECK: supple, thyroid normal size, non-tender, without nodularity BREAST: Bilateral breasts inspected and palpated.  No palpable masses or regional adenopathy  LABORATORY DATA:  I have reviewed the data as listed Lab Results  Component Value Date   WBC 4.9 07/31/2014   HGB 13.4 07/31/2014   HCT 40.0 07/31/2014   MCV 95.2 07/31/2014   PLT 180.0 07/31/2014   Lab Results  Component Value Date   NA 139 07/31/2014   K 4.4 07/31/2014   CL 105 07/31/2014   CO2 30  07/31/2014    RADIOGRAPHIC STUDIES: I have personally reviewed the radiological reports and agreed with the findings in the report.  ASSESSMENT AND PLAN:  At high risk for breast cancer This is a very pleasant 53 year old female patient with family history significant for breast cancer in mother, maternal aunt, maternal grandmother, paternal grandmother, genetic testing negative referred to high risk breast clinic given her increased lifetime risk of breast cancer.  I have reviewed the myriad score which did not show any evidence of pathogenic gene mutations.  Based on myriad score, lifetime risk was calculated at greater than 30% however upon review of details and recalculating the score based on Tyrer-Cuzick model today, her risk is just shy of 20% Our geneticist has also reviewed this and agrees with the calculated lifetime risk of breast cancer just under 20%.  Breast Cancer Risk Lifetime risk just under 20%. Last MRI was in February 2023. No changes noted in self-breast exam. Normal breast exam today. -Order MRI now, as it has been 2 years since the last one. -Plan to alternate MRI and mammogram every year, with the next mammogram in August 2025. -Consider reevaluating tamoxifen use in the future, as the current 5-year risk is 2.1%.  General Health Maintenance Discussed the importance of lifestyle factors such as  social interaction, sleep, and stress management in overall health and cancer risk. -Encourage continued physical activity. -Self breast exam monthly, follow-up with PCP in approximately 6 months from now and return to clinic for follow-up with [place] in 1 year.  Medication Update Patient is taking L-lysine supplement for fever blisters and has stopped taking Valtrex. -Continue current regimen as it is effective.     Total time spent: 30 minutes including history, physical exam, review of records, counseling and coordination of care All questions were answered. The patient knows to call the clinic with any problems, questions or concerns.    Rachel Moulds, MD 11/12/23

## 2023-12-27 ENCOUNTER — Ambulatory Visit
Admission: RE | Admit: 2023-12-27 | Discharge: 2023-12-27 | Disposition: A | Discharge status: Home / Self Care | Payer: BC Managed Care – PPO | Source: Ambulatory Visit | Attending: Hematology and Oncology | Admitting: Hematology and Oncology

## 2023-12-27 DIAGNOSIS — Z9189 Other specified personal risk factors, not elsewhere classified: Secondary | ICD-10-CM

## 2023-12-27 DIAGNOSIS — Z803 Family history of malignant neoplasm of breast: Secondary | ICD-10-CM | POA: Diagnosis not present

## 2023-12-27 DIAGNOSIS — Z1239 Encounter for other screening for malignant neoplasm of breast: Secondary | ICD-10-CM | POA: Diagnosis not present

## 2023-12-27 MED ORDER — GADOPICLENOL 0.5 MMOL/ML IV SOLN
6.0000 mL | Freq: Once | INTRAVENOUS | Status: AC | PRN
  Administered 2023-12-27: 6 mL via INTRAVENOUS

## 2024-01-07 DIAGNOSIS — Z13820 Encounter for screening for osteoporosis: Secondary | ICD-10-CM | POA: Diagnosis not present

## 2024-02-16 DIAGNOSIS — E785 Hyperlipidemia, unspecified: Secondary | ICD-10-CM | POA: Diagnosis not present

## 2024-02-16 DIAGNOSIS — Z1212 Encounter for screening for malignant neoplasm of rectum: Secondary | ICD-10-CM | POA: Diagnosis not present

## 2024-02-23 DIAGNOSIS — F419 Anxiety disorder, unspecified: Secondary | ICD-10-CM | POA: Diagnosis not present

## 2024-02-23 DIAGNOSIS — R82998 Other abnormal findings in urine: Secondary | ICD-10-CM | POA: Diagnosis not present

## 2024-02-23 DIAGNOSIS — Z1331 Encounter for screening for depression: Secondary | ICD-10-CM | POA: Diagnosis not present

## 2024-02-23 DIAGNOSIS — G5601 Carpal tunnel syndrome, right upper limb: Secondary | ICD-10-CM | POA: Diagnosis not present

## 2024-02-23 DIAGNOSIS — Z0000 Encounter for general adult medical examination without abnormal findings: Secondary | ICD-10-CM | POA: Diagnosis not present

## 2024-02-23 DIAGNOSIS — Z1339 Encounter for screening examination for other mental health and behavioral disorders: Secondary | ICD-10-CM | POA: Diagnosis not present

## 2024-02-23 DIAGNOSIS — Z5181 Encounter for therapeutic drug level monitoring: Secondary | ICD-10-CM | POA: Diagnosis not present
